# Patient Record
Sex: Female | Born: 1960 | Race: White | Hispanic: No | Smoking: Current every day smoker
Health system: Southern US, Community
[De-identification: ages and names within clinical notes are randomized; demographics above are authoritative.]

## PROBLEM LIST (undated history)

## (undated) DIAGNOSIS — M2550 Pain in unspecified joint: Secondary | ICD-10-CM

## (undated) DIAGNOSIS — R5383 Other fatigue: Secondary | ICD-10-CM

## (undated) DIAGNOSIS — G709 Myoneural disorder, unspecified: Secondary | ICD-10-CM

## (undated) DIAGNOSIS — K3532 Acute appendicitis with perforation and localized peritonitis, without abscess: Secondary | ICD-10-CM

## (undated) DIAGNOSIS — G4730 Sleep apnea, unspecified: Secondary | ICD-10-CM

## (undated) DIAGNOSIS — G2581 Restless legs syndrome: Secondary | ICD-10-CM

## (undated) DIAGNOSIS — F419 Anxiety disorder, unspecified: Secondary | ICD-10-CM

## (undated) HISTORY — DX: Restless legs syndrome: G25.81

## (undated) HISTORY — PX: CARPAL TUNNEL RELEASE: SHX101

## (undated) HISTORY — DX: Myoneural disorder, unspecified: G70.9

## (undated) HISTORY — DX: Other fatigue: R53.83

## (undated) HISTORY — DX: Acute appendicitis with perforation, localized peritonitis, and gangrene, without abscess: K35.32

## (undated) HISTORY — DX: Anxiety disorder, unspecified: F41.9

## (undated) HISTORY — DX: Pain in unspecified joint: M25.50

---

## 2015-08-01 ENCOUNTER — Other Ambulatory Visit (HOSPITAL_COMMUNITY): Payer: Self-pay | Admitting: Family Medicine

## 2015-08-01 DIAGNOSIS — Z1231 Encounter for screening mammogram for malignant neoplasm of breast: Secondary | ICD-10-CM

## 2015-08-05 ENCOUNTER — Ambulatory Visit (HOSPITAL_COMMUNITY)
Admission: RE | Admit: 2015-08-05 | Discharge: 2015-08-05 | Disposition: A | Discharge status: Home / Self Care | Payer: BLUE CROSS/BLUE SHIELD | Source: Ambulatory Visit | Attending: Family Medicine | Admitting: Family Medicine

## 2015-08-05 DIAGNOSIS — Z1231 Encounter for screening mammogram for malignant neoplasm of breast: Secondary | ICD-10-CM | POA: Insufficient documentation

## 2015-09-12 ENCOUNTER — Encounter: Payer: Self-pay | Admitting: Neurology

## 2015-09-12 ENCOUNTER — Ambulatory Visit (INDEPENDENT_AMBULATORY_CARE_PROVIDER_SITE_OTHER): Payer: BLUE CROSS/BLUE SHIELD | Admitting: Neurology

## 2015-09-12 VITALS — BP 117/80 | HR 74 | Ht 65.0 in | Wt 171.5 lb

## 2015-09-12 DIAGNOSIS — R5382 Chronic fatigue, unspecified: Secondary | ICD-10-CM

## 2015-09-12 DIAGNOSIS — G4710 Hypersomnia, unspecified: Secondary | ICD-10-CM | POA: Diagnosis not present

## 2015-09-12 DIAGNOSIS — M2550 Pain in unspecified joint: Secondary | ICD-10-CM

## 2015-09-12 MED ORDER — MELOXICAM 7.5 MG PO TABS: 7.5000 mg | ORAL_TABLET | Freq: Two times a day (BID) | ORAL | Status: DC | PRN

## 2015-09-12 NOTE — Progress Notes (Signed)
PATIENT: Amanda Wilcox DOB: 1961/04/16  Chief Complaint  Patient presents with  . Joint Pain    She is here to have her persistent joint pain and fatigue evaluated.  Her pain tends to be worse in her neck, shoulders and low back.  She is concerned about excessive daytime sleepiness.     HISTORICAL  Amanda Wilcox is a 55 years old left-handed female, alone at today's clinical visit, seen in refer by her primary care PA Jake Samples for evaluation of multiple joints pain, fatigue in September 12 2015,  She is a longtime smoker, currently taking Wellbutrin, trying to quit smoking, worked at [employer].  She complains more than 10 years history of chronic neck, low back pain, getting worse in the past couple years, she has constant neck, bilateral shoulder deep achy pain, could not find a comfortable position to sleep, denies persistent upper lower extremity paresthesia or weakness, she has urinary urgency, has intermittent constipation and diarrhea, has no incontinence. She also complains of excessive daytime fatigue, sleepiness, today's ESS score is 11, FSS score is 53, she complains of excessive nighttime snoring, frequent awakening with dry mouth.  She also complains of frequent bilateral lower extremity numbness day sitting in a soft recliner, but denied persistent sensory changes or weakness   Palma Holter in her legs, she could not benign over, aggravated by going hte dargen work,   I reviewed laboratory evaluation in February 2017, normal CBC, CMP, vitamin D level was mildly decreased 27.6, mild elevated LDL 112, total cholesterol 223, ESR  REVIEW OF SYSTEMS: Full 14 system review of systems performed and notable only for Fatigue, snoring, diarrhea, constipation, joint pain, achy muscles, allergy, runny nose, numbness, weakness, insomnia, snoring, restless legs, anxiety, not enough sleep.  ALLERGIES: No Known Allergies  HOME MEDICATIONS: Current Outpatient Prescriptions    Medication Sig Dispense Refill  . buPROPion (WELLBUTRIN SR) 150 MG 12 hr tablet Take 150 mg by mouth 2 (two) times daily.     No current facility-administered medications for this visit.    PAST MEDICAL HISTORY: Past Medical History  Diagnosis Date  . Restless leg syndrome   . Arthralgia   . Fatigue   . Anxiety     Trying to quit smoking - has cut back.    PAST SURGICAL HISTORY: Past Surgical History  Procedure Laterality Date  . Carpal tunnel release Right     FAMILY HISTORY: Family History  Problem Relation Age of Onset  . Stroke Mother     SOCIAL HISTORY:  Social History   Social History  . Marital Status: Divorced    Spouse Name: N/A  . Number of Children: 3  . Years of Education: GED   Occupational History  . Machinery/Factory worker    Social History Main Topics  . Smoking status: Current Every Day Smoker -- 0.50 packs/day    Types: Cigarettes  . Smokeless tobacco: Not on file  . Alcohol Use: No  . Drug Use: No  . Sexual Activity: Not on file   Other Topics Concern  . Not on file   Social History Narrative   Lives at home with roommate.   Left-handed.   2-3 cups caffeine per day.     PHYSICAL EXAM   Filed Vitals:   09/12/15 1304  BP: 117/80  Pulse: 74  Height: 5' 5"  (1.651 m)  Weight: 171 lb 8 oz (77.792 kg)    Not recorded      Body mass index is  28.54 kg/(m^2).  PHYSICAL EXAMNIATION:  Gen: NAD, conversant, well nourised, obese, well groomed                     Cardiovascular: Regular rate rhythm, no peripheral edema, warm, nontender. Eyes: Conjunctivae clear without exudates or hemorrhage Neck: Supple, no carotid bruise. Pulmonary: Clear to auscultation bilaterally   NEUROLOGICAL EXAM:  MENTAL STATUS: Speech:    Speech is normal; fluent and spontaneous with normal comprehension.  Cognition:     Orientation to time, place and person     Normal recent and remote memory     Normal Attention span and concentration      Normal Language, naming, repeating,spontaneous speech     Fund of knowledge   CRANIAL NERVES: CN II: Visual fields are full to confrontation. Fundoscopic exam is normal with sharp discs and no vascular changes. Pupils are round equal and briskly reactive to light. CN III, IV, VI: extraocular movement are normal. No ptosis. CN V: Facial sensation is intact to pinprick in all 3 divisions bilaterally. Corneal responses are intact.  CN VII: Face is symmetric with normal eye closure and smile. CN VIII: Hearing is normal to rubbing fingers CN IX, X: Palate elevates symmetrically. Phonation is normal. CN XI: Head turning and shoulder shrug are intact CN XII: Tongue is midline with normal movements and no atrophy.  MOTOR: There is no pronator drift of out-stretched arms. Muscle bulk and tone are normal. Muscle strength is normal.  REFLEXES: Reflexes are 2+ and symmetric at the biceps, triceps, knees, and ankles. Plantar responses are flexor.  SENSORY: Intact to light touch, pinprick, positional sensation and vibratory sensation are intact in fingers and toes.  COORDINATION: Rapid alternating movements and fine finger movements are intact. There is no dysmetria on finger-to-nose and heel-knee-shin.    GAIT/STANCE: Posture is normal. Gait is steady with normal steps, base, arm swing, and turning. Heel and toe walking are normal. Tandem gait is normal.  Romberg is absent.   DIAGNOSTIC DATA (LABS, IMAGING, TESTING) - I reviewed patient records, labs, notes, testing and imaging myself where available.   ASSESSMENT AND PLAN  Damyra Luscher is a 55 y.o. female   Excessive fatigue, daytime sleepiness ESS is 11, FSS is 53  She also has mild snoring at nighttime,  Consistent with obstructive sleep apnea  Refer her to sleep study  Chronic neck pain, low back pain,  EMG nerve conduction study  Laboratory evaluations, including CPK, TSH, to rule out inflammatory myopathy, differentiation  diagnosis also includes cervical and lumbar sacral radiculopathy  Mobic 7.5 twice a day  as needed   Marcial Pacas, M.D. Ph.D.  [facility] [address] [place], [place] [address] Ph: [phone] Fax: [phone]  CC: Jake Samples, PA-C

## 2015-09-13 LAB — CK: Total CK: 135 U/L (ref 24–173)

## 2015-09-13 LAB — ANA W/REFLEX IF POSITIVE
Anti Nuclear Antibody(ANA): POSITIVE — AB
Centromere Ab Screen: <0.2 AI (ref 0.0–0.9)
Chromatin Ab SerPl-aCnc: <0.2 AI (ref 0.0–0.9)
DSDNA AB: 20 [IU]/mL — AB (ref 0–9)
ENA RNP Ab: <0.2 AI (ref 0.0–0.9)
ENA SM Ab Ser-aCnc: <0.2 AI (ref 0.0–0.9)
SCL 70: 0.2 AI (ref 0.0–0.9)

## 2015-09-13 LAB — C-REACTIVE PROTEIN: CRP: 1.3 mg/L (ref 0.0–4.9)

## 2015-09-13 LAB — THYROID PANEL WITH TSH
FREE THYROXINE INDEX: 2.1 (ref 1.2–4.9)
T3 UPTAKE RATIO: 26 % (ref 24–39)
T4, Total: 8.1 ug/dL (ref 4.5–12.0)
TSH: 1.72 u[IU]/mL (ref 0.450–4.500)

## 2015-09-16 ENCOUNTER — Telehealth: Payer: Self-pay | Admitting: Neurology

## 2015-09-16 NOTE — Telephone Encounter (Signed)
Attempted to reach patient again - left another message for a return call.

## 2015-09-16 NOTE — Telephone Encounter (Signed)
Left message for a return call

## 2015-09-16 NOTE — Telephone Encounter (Signed)
Please call patient, laboratory evaluation showed positive ANA with double-stranded DNA antibody, which usually indicating autoimmune process, I have forwarded to laboratory evaluation to her primary care physician, she should contact them for repeat testing potentially for the workup, she should keep her follow-up appointment for EMG nerve conduction study in May 30 first 2017

## 2015-09-17 NOTE — Telephone Encounter (Signed)
Left message for a return call

## 2015-09-17 NOTE — Telephone Encounter (Signed)
She is aware of lab results and will contact her PCP to schedule a follow up appt.  She will also keep her pending EMG/NCV appt with Dr. Krista Blue.

## 2015-10-23 ENCOUNTER — Encounter: Payer: BLUE CROSS/BLUE SHIELD | Admitting: Neurology

## 2016-04-07 ENCOUNTER — Other Ambulatory Visit (HOSPITAL_COMMUNITY): Payer: Self-pay | Admitting: Neurology

## 2016-04-07 ENCOUNTER — Ambulatory Visit (HOSPITAL_COMMUNITY)
Admission: RE | Admit: 2016-04-07 | Discharge: 2016-04-07 | Disposition: A | Discharge status: Home / Self Care | Payer: BLUE CROSS/BLUE SHIELD | Source: Ambulatory Visit | Attending: Neurology | Admitting: Neurology

## 2016-04-07 DIAGNOSIS — M50322 Other cervical disc degeneration at C5-C6 level: Secondary | ICD-10-CM | POA: Insufficient documentation

## 2016-04-07 DIAGNOSIS — Q765 Cervical rib: Secondary | ICD-10-CM | POA: Diagnosis not present

## 2016-04-07 DIAGNOSIS — R52 Pain, unspecified: Secondary | ICD-10-CM

## 2016-04-07 DIAGNOSIS — M542 Cervicalgia: Secondary | ICD-10-CM | POA: Diagnosis present

## 2016-05-22 ENCOUNTER — Encounter (HOSPITAL_BASED_OUTPATIENT_CLINIC_OR_DEPARTMENT_OTHER): Payer: Self-pay

## 2016-05-22 DIAGNOSIS — G4733 Obstructive sleep apnea (adult) (pediatric): Secondary | ICD-10-CM

## 2016-06-07 ENCOUNTER — Ambulatory Visit: Payer: BLUE CROSS/BLUE SHIELD | Attending: Neurology | Admitting: Neurology

## 2016-06-07 DIAGNOSIS — G4733 Obstructive sleep apnea (adult) (pediatric): Secondary | ICD-10-CM | POA: Insufficient documentation

## 2016-06-11 NOTE — Procedures (Signed)
   Theodore A. Merlene Laughter, MD     [email or web address]             NOCTURNAL POLYSOMNOGRAPHY   LOCATION: [facility]  Patient Name: Amanda Wilcox, Amanda Wilcox Study Date: 06/07/2016 Gender: Female D.O.B: Oct 16, 1960 Age (years): 78 Referring Provider: Barton Fanny NP Height (inches): 64 Interpreting Physician: Phillips Odor MD, ABSM Weight (lbs): 167 RPSGT: Rosebud Poles BMI: 29 MRN: [id number] Neck Size: 14.50 CLINICAL INFORMATION Sleep Study Type: NPSG  Indication for sleep study: N/A  Epworth Sleepiness Score: 17  SLEEP STUDY TECHNIQUE As per the AASM Manual for the Scoring of Sleep and Associated Events v2.3 (April 2016) with a hypopnea requiring 4% desaturations.  The channels recorded and monitored were frontal, central and occipital EEG, electrooculogram (EOG), submentalis EMG (chin), nasal and oral airflow, thoracic and abdominal wall motion, anterior tibialis EMG, snore microphone, electrocardiogram, and pulse oximetry.  MEDICATIONS Medications self-administered by patient taken the night of the study : N/A  Current Outpatient Prescriptions:  .  buPROPion (WELLBUTRIN SR) 150 MG 12 hr tablet, Take 150 mg by mouth 2 (two) times daily., Disp: , Rfl:  .  meloxicam (MOBIC) 7.5 MG tablet, Take 1 tablet (7.5 mg total) by mouth 2 (two) times daily as needed for pain., Disp: 60 tablet, Rfl: 6   SLEEP ARCHITECTURE The study was initiated at 10:12:32 PM and ended at 4:38:52 AM.  Sleep onset time was 5.2 minutes and the sleep efficiency was 94.6%. The total sleep time was 365.5 minutes.  Stage REM latency was 188.5 minutes.  The patient spent 2.87% of the night in stage N1 sleep, 49.93% in stage N2 sleep, 27.22% in stage N3 and 19.97% in REM.  Alpha intrusion was absent.  Supine sleep was 4.38%.  RESPIRATORY PARAMETERS The overall apnea/hypopnea index (AHI) was 10.0 per hour. There were 16 total apneas, including 6 obstructive, 2 central and 8 mixed  apneas. There were 45 hypopneas and 9 RERAs.  The AHI during Stage REM sleep was 46.0 per hour.  AHI while supine was 60.0 per hour.  The mean oxygen saturation was 91.83%. The minimum SpO2 during sleep was 83.00%.  Soft snoring was noted during this study.  CARDIAC DATA The 2 lead EKG demonstrated sinus rhythm. The mean heart rate was N/A beats per minute. Other EKG findings include: None. LEG MOVEMENT DATA The total PLMS were 21 with a resulting PLMS index of 3.45. Associated arousal with leg movement index was 3.0.  IMPRESSIONS - Mild to moderate obstructive sleep apnea is observed during this recording. A trial of auto Pap 8-15 is recommended.   Delano Metz, MD Diplomate, American Board of Sleep Medicine.

## 2016-11-15 ENCOUNTER — Emergency Department (HOSPITAL_COMMUNITY)
Admission: EM | Admit: 2016-11-15 | Discharge: 2016-11-15 | Disposition: A | Discharge status: Home Health | Payer: BLUE CROSS/BLUE SHIELD | Attending: Emergency Medicine | Admitting: Emergency Medicine

## 2016-11-15 ENCOUNTER — Encounter (HOSPITAL_COMMUNITY): Payer: Self-pay | Admitting: Emergency Medicine

## 2016-11-15 DIAGNOSIS — S61411A Laceration without foreign body of right hand, initial encounter: Secondary | ICD-10-CM | POA: Insufficient documentation

## 2016-11-15 DIAGNOSIS — W260XXA Contact with knife, initial encounter: Secondary | ICD-10-CM | POA: Diagnosis not present

## 2016-11-15 DIAGNOSIS — Y929 Unspecified place or not applicable: Secondary | ICD-10-CM | POA: Diagnosis not present

## 2016-11-15 DIAGNOSIS — Y999 Unspecified external cause status: Secondary | ICD-10-CM | POA: Insufficient documentation

## 2016-11-15 DIAGNOSIS — Y9389 Activity, other specified: Secondary | ICD-10-CM | POA: Diagnosis not present

## 2016-11-15 DIAGNOSIS — S6991XA Unspecified injury of right wrist, hand and finger(s), initial encounter: Secondary | ICD-10-CM | POA: Diagnosis present

## 2016-11-15 MED ORDER — LIDOCAINE HCL (PF) 2 % IJ SOLN
10.0000 mL | Freq: Once | INTRAMUSCULAR | Status: DC
  Filled 2016-11-15: qty 10

## 2016-11-15 NOTE — ED Triage Notes (Signed)
Cut palm of rt hand with knife trying to get a dog bone out of a package

## 2016-11-15 NOTE — ED Provider Notes (Signed)
[facility] DEPT Provider Note   CSN: [id number] Arrival date & time: 11/15/16  1346     History   Chief Complaint Chief Complaint  Patient presents with  . Laceration    HPI Amanda Wilcox is a 56 y.o. female.  HPI   Amanda Wilcox is a 56 y.o. female who presents to the Emergency Department complaining of laceration to the right hand.  Occurred while using a kitchen knife to cut open a package.  She reports minimal bleeding.  She cleaned the wound with peroxide.  She denies numbness or weakness of her hand or fingers. Td is up to date  Past Medical History:  Diagnosis Date  . Anxiety    Trying to quit smoking - has cut back.  . Arthralgia   . Fatigue   . Restless leg syndrome     There are no active problems to display for this patient.   Past Surgical History:  Procedure Laterality Date  . CARPAL TUNNEL RELEASE Right     OB History    No data available       Home Medications    Prior to Admission medications   Medication Sig Start Date End Date Taking? Authorizing Provider  buPROPion (WELLBUTRIN SR) 150 MG 12 hr tablet Take 150 mg by mouth 2 (two) times daily.    [provider]  meloxicam (MOBIC) 7.5 MG tablet Take 1 tablet (7.5 mg total) by mouth 2 (two) times daily as needed for pain. 09/12/15   Marcial Pacas, MD    Family History Family History  Problem Relation Age of Onset  . Stroke Mother     Social History Social History  Substance Use Topics  . Smoking status: Former Smoker    Packs/day: 0.50    Years: 40.00    Types: Cigarettes    Quit date: 11/08/2016  . Smokeless tobacco: Never Used  . Alcohol use No     Allergies   Patient has no known allergies.   Review of Systems Review of Systems  Constitutional: Negative for chills and fever.  Musculoskeletal: Negative for arthralgias, back pain and joint swelling.  Skin: Positive for wound. Negative for color change.       Laceration right hand  Neurological: Negative  for dizziness, weakness and numbness.  Hematological: Does not bruise/bleed easily.  All other systems reviewed and are negative.    Physical Exam Updated Vital Signs BP 132/72 (BP Location: Right Arm)   Pulse 67   Temp 98.2 F (36.8 C) (Oral)   Resp 20   Ht 5\' 4"  (1.626 m)   Wt 74.8 kg (165 lb)   SpO2 98%   BMI 28.32 kg/m   Physical Exam  Constitutional: She is oriented to person, place, and time. She appears well-developed and well-nourished. No distress.  HENT:  Head: Atraumatic.  Cardiovascular: Normal rate, regular rhythm and intact distal pulses.   Pulmonary/Chest: Effort normal and breath sounds normal. No respiratory distress.  Musculoskeletal: Normal range of motion. She exhibits no tenderness.  2.5 cm laceration to the distal right palm near base of the index finger.  Bleeding controlled.  No FB's  Neurological: She is alert and oriented to person, place, and time. No sensory deficit.  Skin: Skin is warm. Capillary refill takes less than 2 seconds. No erythema.  Psychiatric: She has a normal mood and affect.  Nursing note and vitals reviewed.    ED Treatments / Results  Labs (all labs ordered are listed, but only abnormal results  are displayed) Labs Reviewed - No data to display  EKG  EKG Interpretation None       Radiology No results found.  Procedures Procedures (including critical care time)  LACERATION REPAIR Performed by: Irma Roulhac L. Authorized by: Hale Bogus Consent: Verbal consent obtained. Risks and benefits: risks, benefits and alternatives were discussed Consent given by: patient Patient identity confirmed: provided demographic data Prepped and Draped in normal sterile fashion Wound explored  Laceration Location: right hand  Laceration Length: 2.5 cm  No Foreign Bodies seen or palpated  Anesthesia: local infiltration  Local anesthetic: lidocaine 2 % w/o epinephrine  Anesthetic total: 2  ml  Irrigation method:  syringe Amount of cleaning: standard  Skin closure: 4-0 prolene  Number of sutures: 4  Technique: simple interrupted  Patient tolerance: Patient tolerated the procedure well with no immediate complications.  Medications Ordered in ED Medications - No data to display   Initial Impression / Assessment and Plan / ED Course  I have reviewed the triage vital signs and the nursing notes.  Pertinent labs & imaging results that were available during my care of the patient were reviewed by me and considered in my medical decision making (see chart for details).     NV intact.  Pt has full ROM of the fingers.  Wounds care instructions given,  Sutures out in 7-10 days.     Final Clinical Impressions(s) / ED Diagnoses   Final diagnoses:  Laceration of right hand without foreign body, initial encounter    New Prescriptions New Prescriptions   No medications on file     Bufford Lope 11/17/16 2049    Nat Christen, MD 11/23/16 [id number]

## 2016-11-15 NOTE — Discharge Instructions (Signed)
Clean the wound with mild soap and water.  Keep it bandaged.  Sutures out in 10 days.  Return here for any signs of infection

## 2017-03-17 ENCOUNTER — Ambulatory Visit: Payer: BLUE CROSS/BLUE SHIELD | Admitting: Family Medicine

## 2017-04-12 ENCOUNTER — Ambulatory Visit: Payer: BLUE CROSS/BLUE SHIELD | Admitting: Family Medicine

## 2017-05-05 ENCOUNTER — Ambulatory Visit: Payer: BLUE CROSS/BLUE SHIELD | Admitting: Family Medicine

## 2017-10-06 ENCOUNTER — Other Ambulatory Visit (HOSPITAL_COMMUNITY): Payer: Self-pay | Admitting: Neurology

## 2017-10-06 DIAGNOSIS — M542 Cervicalgia: Secondary | ICD-10-CM

## 2017-10-06 DIAGNOSIS — M5412 Radiculopathy, cervical region: Secondary | ICD-10-CM

## 2017-10-15 ENCOUNTER — Ambulatory Visit (HOSPITAL_COMMUNITY): Payer: BLUE CROSS/BLUE SHIELD

## 2017-10-19 ENCOUNTER — Ambulatory Visit (HOSPITAL_COMMUNITY)
Admission: RE | Admit: 2017-10-19 | Discharge: 2017-10-19 | Disposition: A | Discharge status: Home / Self Care | Payer: BLUE CROSS/BLUE SHIELD | Source: Ambulatory Visit | Attending: Neurology | Admitting: Neurology

## 2017-10-19 DIAGNOSIS — M4802 Spinal stenosis, cervical region: Secondary | ICD-10-CM | POA: Diagnosis not present

## 2017-10-19 DIAGNOSIS — M542 Cervicalgia: Secondary | ICD-10-CM

## 2017-10-19 DIAGNOSIS — M5412 Radiculopathy, cervical region: Secondary | ICD-10-CM | POA: Diagnosis present

## 2017-10-19 DIAGNOSIS — M4722 Other spondylosis with radiculopathy, cervical region: Secondary | ICD-10-CM | POA: Insufficient documentation

## 2017-12-07 ENCOUNTER — Other Ambulatory Visit: Payer: Self-pay | Admitting: Neurology

## 2017-12-07 DIAGNOSIS — M542 Cervicalgia: Secondary | ICD-10-CM

## 2017-12-08 ENCOUNTER — Ambulatory Visit (HOSPITAL_COMMUNITY)
Admission: RE | Admit: 2017-12-08 | Discharge: 2017-12-08 | Disposition: A | Discharge status: Home / Self Care | Payer: BLUE CROSS/BLUE SHIELD | Source: Ambulatory Visit | Attending: Neurology | Admitting: Neurology

## 2017-12-08 ENCOUNTER — Other Ambulatory Visit (HOSPITAL_COMMUNITY): Payer: Self-pay | Admitting: Neurology

## 2017-12-08 DIAGNOSIS — M545 Low back pain: Secondary | ICD-10-CM | POA: Insufficient documentation

## 2017-12-08 DIAGNOSIS — M5137 Other intervertebral disc degeneration, lumbosacral region: Secondary | ICD-10-CM | POA: Diagnosis not present

## 2017-12-20 ENCOUNTER — Ambulatory Visit
Admission: RE | Admit: 2017-12-20 | Discharge: 2017-12-20 | Disposition: A | Discharge status: Home / Self Care | Payer: BLUE CROSS/BLUE SHIELD | Source: Ambulatory Visit | Attending: Neurology | Admitting: Neurology

## 2017-12-20 DIAGNOSIS — M542 Cervicalgia: Secondary | ICD-10-CM

## 2017-12-20 MED ORDER — TRIAMCINOLONE ACETONIDE 40 MG/ML IJ SUSP (RADIOLOGY)
60.0000 mg | Freq: Once | INTRAMUSCULAR | Status: AC
  Administered 2017-12-20: 60 mg via EPIDURAL

## 2017-12-20 MED ORDER — IOPAMIDOL (ISOVUE-M 200) INJECTION 41%
1.0000 mL | Freq: Once | INTRAMUSCULAR | Status: AC
  Administered 2017-12-20: 1 mL via EPIDURAL

## 2017-12-20 NOTE — Discharge Instructions (Signed)

## 2018-01-12 ENCOUNTER — Other Ambulatory Visit: Payer: Self-pay | Admitting: Neurology

## 2018-01-12 DIAGNOSIS — M5412 Radiculopathy, cervical region: Secondary | ICD-10-CM

## 2018-01-28 ENCOUNTER — Ambulatory Visit
Admission: RE | Admit: 2018-01-28 | Discharge: 2018-01-28 | Disposition: A | Discharge status: Home / Self Care | Payer: BLUE CROSS/BLUE SHIELD | Source: Ambulatory Visit | Attending: Neurology | Admitting: Neurology

## 2018-01-28 ENCOUNTER — Other Ambulatory Visit: Payer: BLUE CROSS/BLUE SHIELD

## 2018-01-28 DIAGNOSIS — M5412 Radiculopathy, cervical region: Secondary | ICD-10-CM

## 2018-01-28 MED ORDER — TRIAMCINOLONE ACETONIDE 40 MG/ML IJ SUSP (RADIOLOGY)
60.0000 mg | Freq: Once | INTRAMUSCULAR | Status: AC
  Administered 2018-01-28: 60 mg via EPIDURAL

## 2018-01-28 MED ORDER — IOPAMIDOL (ISOVUE-M 300) INJECTION 61%
1.0000 mL | Freq: Once | INTRAMUSCULAR | Status: AC | PRN
  Administered 2018-01-28: 1 mL via EPIDURAL

## 2018-11-17 ENCOUNTER — Other Ambulatory Visit: Payer: Self-pay

## 2018-11-17 ENCOUNTER — Encounter (INDEPENDENT_AMBULATORY_CARE_PROVIDER_SITE_OTHER): Payer: Self-pay

## 2018-11-17 ENCOUNTER — Ambulatory Visit (INDEPENDENT_AMBULATORY_CARE_PROVIDER_SITE_OTHER): Payer: BC Managed Care – PPO | Admitting: Family Medicine

## 2018-11-17 ENCOUNTER — Encounter: Payer: Self-pay | Admitting: Family Medicine

## 2018-11-17 VITALS — BP 123/79 | HR 85 | Temp 98.9°F | Resp 12 | Ht 64.0 in | Wt 208.0 lb

## 2018-11-17 DIAGNOSIS — M797 Fibromyalgia: Secondary | ICD-10-CM

## 2018-11-17 DIAGNOSIS — Z720 Tobacco use: Secondary | ICD-10-CM | POA: Diagnosis not present

## 2018-11-17 DIAGNOSIS — E669 Obesity, unspecified: Secondary | ICD-10-CM | POA: Diagnosis not present

## 2018-11-17 NOTE — Progress Notes (Signed)
Subjective:     Patient ID: Amanda Wilcox, female   DOB: 1961-04-06, 58 y.o.   MRN: [id number]  Amanda Wilcox presents for New Patient (Initial Visit) (establish care) Amanda Wilcox is a 58 year old female patient who has a history of neuromuscular disorder-fibromyalgia, arthralgia, fatigue, anxiety, restless leg syndrome, current smoker.  Is currently on long-term disability.   Is followed by Dr. Florene Glen for her fibromyalgia.  Weight wise she reports that she has not always had a lot of weight on her.  But since she is been on disability she is put on a lot of weight.  She reports she does not eat a lot, unsure why her weight is up.  Was trying to walk some but has difficulty with that because of her pain and discomfort.  Reports that her menses stopped over 5 years ago.  Is unsure when her last Pap smear was.  Was getting her Pap smears with her last PCP at [facility].  Additionally unsure if she is fully up-to-date on her immunizations or other screenings.  Will be getting a release form for her records.  Social: Lives with her roommate, who was her ex-boyfriend.  They have 1 dog.  She has 2 daughters and 1 son.  She has several grandchildren.  She low spending time with her friends, play having a night, cookout spending time with her children and grandkids.  She reports that she eats a well-rounded diet.  That consist of heavy meat.  Veggies and fruits but could eat more veggies and fruits.  Reports she does eat a lot of breads and meats though.  Reports she has about 2 cups of coffee with cream and sugar.  Does not really drink a lot of soda and less as the only thing around.  Reports she only drinks about 5 cups of water daily.  Reports she does wear her seatbelt, sunscreen, has carbon monoxide and smoke detectors.  Additionally she reports she does not use her phone while she is driving.  She does not drink alcohol.  She is currently a smoker.  Overall Amanda Wilcox feels that she is doing well  today.  She does not have any fever, chills, cough, shortness of breath or any other signs or symptoms of infection.  She does report that she has some environmental allergies which cause her to have congestion and postnasal drip but she does not feel this is related to COVID or any other infections.  She denies having chest pain, chest tightness, leg swelling, palpitations, dizziness, headaches, vision changes.  She denies having any excessive thirst, hunger or urination.  Past Medical, Surgical, Social History, Allergies, and Medications have been Reviewed. .   Past Medical History:  Diagnosis Date  . Anxiety    Trying to quit smoking - has cut back.  . Arthralgia   . Fatigue   . Restless leg syndrome    Past Surgical History:  Procedure Laterality Date  . CARPAL TUNNEL RELEASE Right    Social History   Socioeconomic History  . Marital status: Divorced    Spouse name: Not on file  . Number of children: 3  . Years of education: GED  . Highest education level: Not on file  Occupational History  . Occupation: Chief Technology Officer  Social Needs  . Financial resource strain: Not on file  . Food insecurity    Worry: Not on file    Inability: Not on file  . Transportation needs    Medical: Not on  file    Non-medical: Not on file  Tobacco Use  . Smoking status: Former Smoker    Packs/day: 0.50    Years: 40.00    Pack years: 20.00    Types: Cigarettes    Quit date: 11/08/2016    Years since quitting: 2.0  . Smokeless tobacco: Never Used  Substance and Sexual Activity  . Alcohol use: No    Alcohol/week: 0.0 standard drinks  . Drug use: No  . Sexual activity: Not on file  Lifestyle  . Physical activity    Days per week: Not on file    Minutes per session: Not on file  . Stress: Not on file  Relationships  . Social Herbalist on phone: Not on file    Gets together: Not on file    Attends religious service: Not on file    Active member of club or  organization: Not on file    Attends meetings of clubs or organizations: Not on file    Relationship status: Not on file  . Intimate partner violence    Fear of current or ex partner: Not on file    Emotionally abused: Not on file    Physically abused: Not on file    Forced sexual activity: Not on file  Other Topics Concern  . Not on file  Social History Narrative   Lives at home with roommate.   Left-handed.   2-3 cups caffeine per day.    Outpatient Encounter Medications as of 11/17/2018  Medication Sig  . CHANTIX 1 MG tablet Take 1 mg by mouth daily.  . DULoxetine (CYMBALTA) 60 MG capsule Take 60 mg by mouth 2 (two) times a day.  . gabapentin (NEURONTIN) 300 MG capsule Take 300 mg by mouth 2 (two) times a day.  Marland Kitchen HYDROcodone-acetaminophen (NORCO/VICODIN) 5-325 MG tablet Take 1 tablet by mouth daily as needed for pain.  . meloxicam (MOBIC) 15 MG tablet Take 15 mg by mouth daily.  . modafinil (PROVIGIL) 200 MG tablet Take 200 mg by mouth daily.  Marland Kitchen oxybutynin (DITROPAN-XL) 5 MG 24 hr tablet Take 5 mg by mouth daily.  . [DISCONTINUED] buPROPion (WELLBUTRIN SR) 150 MG 12 hr tablet Take 150 mg by mouth 2 (two) times daily.  . [DISCONTINUED] meloxicam (MOBIC) 7.5 MG tablet Take 1 tablet (7.5 mg total) by mouth 2 (two) times daily as needed for pain. (Patient not taking: Reported on 11/17/2018)   No facility-administered encounter medications on file as of 11/17/2018.    No Known Allergies  Review of Systems  Constitutional: Negative for activity change, appetite change, chills and fever.  HENT: Positive for congestion and postnasal drip.        Dentist: needs to get in with one  Eyes: Negative for visual disturbance.       Eye dr: just saw a few wks back   Respiratory: Negative for cough and shortness of breath.   Cardiovascular: Negative for chest pain, palpitations and leg swelling.  Gastrointestinal: Positive for constipation.  Endocrine: Positive for cold intolerance and heat  intolerance. Negative for polydipsia, polyphagia and polyuria.  Genitourinary: Negative.   Musculoskeletal: Positive for myalgias.  Skin: Negative.   Allergic/Immunologic: Positive for environmental allergies.  Neurological: Negative for dizziness and headaches.  Hematological: Negative.   Psychiatric/Behavioral: Negative for sleep disturbance. The patient is nervous/anxious.   All other systems reviewed and are negative.      Objective:     BP 123/79   Pulse 85  Temp 98.9 F (37.2 C) (Temporal)   Resp 12   Ht 5\' 4"  (1.626 m)   Wt 208 lb (94.3 kg)   SpO2 96%   BMI 35.70 kg/m   Physical Exam Vitals signs and nursing note reviewed.  Constitutional:      Appearance: Normal appearance. She is obese.  HENT:     Head: Normocephalic and atraumatic.     Right Ear: External ear normal.     Left Ear: External ear normal.     Nose: Nose normal.  Eyes:     General:        Right eye: No discharge.        Left eye: No discharge.     Conjunctiva/sclera: Conjunctivae normal.  Neck:     Musculoskeletal: Normal range of motion and neck supple.  Cardiovascular:     Rate and Rhythm: Normal rate and regular rhythm.     Pulses: Normal pulses.     Heart sounds: Normal heart sounds.  Pulmonary:     Effort: Pulmonary effort is normal.     Breath sounds: Normal breath sounds.  Musculoskeletal: Normal range of motion.  Skin:    General: Skin is warm.  Neurological:     Mental Status: She is alert and oriented to person, place, and time.  Psychiatric:        Mood and Affect: Mood normal.        Behavior: Behavior normal.        Thought Content: Thought content normal.        Judgment: Judgment normal.        Assessment and Plan       1. Obesity (BMI 35.0-39.9 without comorbidity) No well controlled. Provided with information about diet, exercise. Encouraged 30 minutes of walking daily.  In addition to eating a more well-balanced diet.  2. Fibromyalgia Currently controlled  and followed by Dr Florene Glen. Will continue with their treatment plans. Appreciate collaboration in her care.  3. Nicotine abuse Asked about quitting: confirms they are currently smokes cigarettes Advise to quit smoking: Educated about QUITTING to reduce the risk of cancer, cardio and cerebrovascular disease. Assess willingness: Unwilling to quit at this time, but is working on cutting back. Assist with counseling and pharmacotherapy: Counseled for 5 minutes and literature provided. Arrange for follow up: not quitting follow up and continue to offer help.    Follow up: 9 months with pap      Amanda Mayo, DNP, AGNP-BC [place], [place], [place] [address] Office Hours: Mon-Thurs 8 am-5 pm; Fri 8 am-12 pm Office Phone:  [phone]  Office Fax: [phone]

## 2018-11-17 NOTE — Patient Instructions (Signed)
Thank you for coming into the office today. I appreciate the opportunity to provide you with the care for your health and wellness. Today we discussed: overall health   Follow Up: 9 months (annual with pap)  No labs today.  Please work on smoking cessation. I have attached information for review. As well as work to get 8 cups of water in daily and to eat a more balanced diet.  When able I strong encourage you to walk 30 minutes daily to help with weight loss and heart health.  [place] YOUR HANDS WELL AND FREQUENTLY. AVOID TOUCHING YOUR FACE, UNLESS YOUR HANDS ARE FRESHLY WASHED.  GET FRESH AIR DAILY. STAY HYDRATED WITH WATER.   It was a pleasure to see you and I look forward to continuing to work together on your health and well-being. Please do not hesitate to call the office if you need care or have questions about your care.  Have a wonderful day and week.  With Gratitude,  Cherly Beach, DNP, AGNP-BC    Please think about quitting smoking.  This is very important for your health.  Consider setting a quit date, then cutting back or switching brands to prepare to stop.  Also think of the money you will save every day by not smoking.  Quick Tips to Quit Smoking:  Fix a date i.e. keep a date in mind from when you would not touch a tobacco product to smoke   Keep yourself busy and block your mind with work loads or reading books or watching movies in malls where smoking is not allowed   Vanish off the things which reminds you about smoking for example match box, or your favorite lighter, or the pipe you used for smoking, or your favorite jeans and shirt with which you used to enjoy smoking, or the club where you used to do smoking   Try to avoid certain people places and incidences where and with whom smoking is a common factor to add on   Praise yourself with some token gifts from the money you saved by stopping smoking   Anti Smoking teams are there to help you. Join  their programs   Anti-smoking Gums are there in many medical shops. Try them to quit smoking   Side-effects of Smoking:  Disease caused by smoking cigarettes are emphysema, bronchitis, heart failures   Premature death   Cancer is the major side effect of smoking   Heart attacks and strokes are the quick effects of smoking causing sudden death   Some smokers lives end up with limbs amputated   Breathing problem or fast breathing is another side effect of smoking   Due to more intakes of smokes, carbon mono-oxide goes into your brain and other muscles of the body which leads to swelling of the veins and blockage to the air passage to lungs   Carbon monoxide blocks blood vessels which leads to blockage in the flow of blood to different major body organs like heart lungs and thus leads to attacks and deaths   During pregnancy smoking is very harmful and leads to premature birth of the infant, spontaneous abortions, low weight of the infant during birth   Fat depositions to narrow and blocked blood vessels causing heart attacks   In many cases cigarette smoking caused infertility in men     Coping with Quitting Smoking  Quitting smoking is a physical and mental challenge. You will face cravings, withdrawal symptoms, and temptation. Before quitting, work  with your health care provider to make a plan that can help you cope. Preparation can help you quit and keep you from giving in. How can I cope with cravings? Cravings usually last for 5-10 minutes. If you get through it, the craving will pass. Consider taking the following actions to help you cope with cravings:  Keep your mouth busy: ? Chew sugar-free gum. ? Suck on hard candies or a straw. ? Brush your teeth.  Keep your hands and body busy: ? Immediately change to a different activity when you feel a craving. ? Squeeze or play with a ball. ? Do an activity or a hobby, like making bead jewelry, practicing needlepoint, or  working with wood. ? Mix up your normal routine. ? Take a short exercise break. Go for a quick walk or run up and down stairs. ? Spend time in public places where smoking is not allowed.  Focus on doing something kind or helpful for someone else.  Call a friend or family member to talk during a craving.  Join a support group.  Call a quit line, such as 1-800-QUIT-NOW.  Talk with your health care provider about medicines that might help you cope with cravings and make quitting easier for you. How can I deal with withdrawal symptoms? Your body may experience negative effects as it tries to get used to not having nicotine in the system. These effects are called withdrawal symptoms. They may include:  Feeling hungrier than normal.  Trouble concentrating.  Irritability.  Trouble sleeping.  Feeling depressed.  Restlessness and agitation.  Craving a cigarette. To manage withdrawal symptoms:  Avoid places, people, and activities that trigger your cravings.  Remember why you want to quit.  Get plenty of sleep.  Avoid coffee and other caffeinated drinks. These may worsen some of your symptoms. How can I handle social situations? Social situations can be difficult when you are quitting smoking, especially in the first few weeks. To manage this, you can:  Avoid parties, bars, and other social situations where people might be smoking.  Avoid alcohol.  Leave right away if you have the urge to smoke.  Explain to your family and friends that you are quitting smoking. Ask for understanding and support.  Plan activities with friends or family where smoking is not an option. What are some ways I can cope with stress? Wanting to smoke may cause stress, and stress can make you want to smoke. Find ways to manage your stress. Relaxation techniques can help. For example:  Breathe slowly and deeply, in through your nose and out through your mouth.  Listen to soothing, relaxing  music.  Talk with a family member or friend about your stress.  Light a candle.  Soak in a bath or take a shower.  Think about a peaceful place. What are some ways I can prevent weight gain? Be aware that many people gain weight after they quit smoking. However, not everyone does. To keep from gaining weight, have a plan in place before you quit and stick to the plan after you quit. Your plan should include:  Having healthy snacks. When you have a craving, it may help to: ? Eat plain popcorn, crunchy carrots, celery, or other cut vegetables. ? Chew sugar-free gum.  Changing how you eat: ? Eat small portion sizes at meals. ? Eat 4-6 small meals throughout the day instead of 1-2 large meals a day. ? Be mindful when you eat. Do not watch television or do other  things that might distract you as you eat.  Exercising regularly: ? Make time to exercise each day. If you do not have time for a long workout, do short bouts of exercise for 5-10 minutes several times a day. ? Do some form of strengthening exercise, like weight lifting, and some form of aerobic exercise, like running or swimming.  Drinking plenty of water or other low-calorie or no-calorie drinks. Drink 6-8 glasses of water daily, or as much as instructed by your health care provider. Summary  Quitting smoking is a physical and mental challenge. You will face cravings, withdrawal symptoms, and temptation to smoke again. Preparation can help you as you go through these challenges.  You can cope with cravings by keeping your mouth busy (such as by chewing gum), keeping your body and hands busy, and making calls to family, friends, or a helpline for people who want to quit smoking.  You can cope with withdrawal symptoms by avoiding places where people smoke, avoiding drinks with caffeine, and getting plenty of rest.  Ask your health care provider about the different ways to prevent weight gain, avoid stress, and handle social  situations. This information is not intended to replace advice given to you by your health care provider. Make sure you discuss any questions you have with your health care provider. Document Released: 05/08/2016 Document Revised: 05/08/2016 Document Reviewed: 05/08/2016 Elsevier Interactive Patient Education  2019 [employer]  Walking is a great form of exercise to increase your strength, endurance and overall fitness.  A walking program can help you start slowly and gradually build endurance as you go.  Everyone's ability is different, so each person's starting point will be different.  You do not have to follow them exactly.  The are just samples. You should simply find out what's right for you and stick to that program.   In the beginning, you'll start off walking 2-3 times a day for short distances.  As you get stronger, you'll be walking further at just 1-2 times per day.  A. You Can Walk For A Certain Length Of Time Each Day    Walk 5 minutes 3 times per day.  Increase 2 minutes every 2 days (3 times per day).  Work up to 25-30 minutes (1-2 times per day).   Example:   Day 1-2 5 minutes 3 times per day   Day 7-8 12 minutes 2-3 times per day   Day 13-14 25 minutes 1-2 times per day  B. You Can Walk For a Certain Distance Each Day     Distance can be substituted for time.    Example:   3 trips to mailbox (at road)   3 trips to corner of block   3 trips around the block  C. Go to local high school and use the track.     Calorie Counting for Weight Loss Calories are units of energy. Your body needs a certain amount of calories from food to keep you going throughout the day. When you eat more calories than your body needs, your body stores the extra calories as fat. When you eat fewer calories than your body needs, your body burns fat to get the energy it needs. Calorie counting means keeping track of how many calories you eat and drink each day. Calorie counting  can be helpful if you need to lose weight. If you make sure to eat fewer calories than your body needs, you should lose weight. Ask your  health care provider what a healthy weight is for you. For calorie counting to work, you will need to eat the right number of calories in a day in order to lose a healthy amount of weight per week. A dietitian can help you determine how many calories you need in a day and will give you suggestions on how to reach your calorie goal.  A healthy amount of weight to lose per week is usually 1-2 lb (0.5-0.9 kg). This usually means that your daily calorie intake should be reduced by 500-750 calories.  Eating 1,200 - 1,500 calories per day can help most women lose weight.  Eating 1,500 - 1,800 calories per day can help most men lose weight.  What do I need to know about calorie counting? In order to meet your daily calorie goal, you will need to:  Find out how many calories are in each food you would like to eat. Try to do this before you eat.  Decide how much of the food you plan to eat.  Write down what you ate and how many calories it had. Doing this is called keeping a food log. To successfully lose weight, it is important to balance calorie counting with a healthy lifestyle that includes regular activity. Aim for 150 minutes of moderate exercise (such as walking) or 75 minutes of vigorous exercise (such as running) each week. Where do I find calorie information?  The number of calories in a food can be found on a Nutrition Facts label. If a food does not have a Nutrition Facts label, try to look up the calories online or ask your dietitian for help. Remember that calories are listed per serving. If you choose to have more than one serving of a food, you will have to multiply the calories per serving by the amount of servings you plan to eat. For example, the label on a package of bread might say that a serving size is 1 slice and that there are 90 calories in a  serving. If you eat 1 slice, you will have eaten 90 calories. If you eat 2 slices, you will have eaten 180 calories. How do I keep a food log? Immediately after each meal, record the following information in your food log:  What you ate. Don't forget to include toppings, sauces, and other extras on the food.  How much you ate. This can be measured in cups, ounces, or number of items.  How many calories each food and drink had.  The total number of calories in the meal. Keep your food log near you, such as in a small notebook in your pocket, or use a mobile app or website. Some programs will calculate calories for you and show you how many calories you have left for the day to meet your goal. What are some calorie counting tips?   Use your calories on foods and drinks that will fill you up and not leave you hungry: ? Some examples of foods that fill you up are nuts and nut butters, vegetables, lean proteins, and high-fiber foods like whole grains. High-fiber foods are foods with more than 5 g fiber per serving. ? Drinks such as sodas, specialty coffee drinks, alcohol, and juices have a lot of calories, yet do not fill you up.  Eat nutritious foods and avoid empty calories. Empty calories are calories you get from foods or beverages that do not have many vitamins or protein, such as candy, sweets, and soda. It is  better to have a nutritious high-calorie food (such as an avocado) than a food with few nutrients (such as a bag of chips).  Know how many calories are in the foods you eat most often. This will help you calculate calorie counts faster.  Pay attention to calories in drinks. Low-calorie drinks include water and unsweetened drinks.  Pay attention to nutrition labels for "low fat" or "fat free" foods. These foods sometimes have the same amount of calories or more calories than the full fat versions. They also often have added sugar, starch, or salt, to make up for flavor that was removed  with the fat.  Find a way of tracking calories that works for you. Get creative. Try different apps or programs if writing down calories does not work for you. What are some portion control tips?  Know how many calories are in a serving. This will help you know how many servings of a certain food you can have.  Use a measuring cup to measure serving sizes. You could also try weighing out portions on a kitchen scale. With time, you will be able to estimate serving sizes for some foods.  Take some time to put servings of different foods on your favorite plates, bowls, and cups so you know what a serving looks like.  Try not to eat straight from a bag or box. Doing this can lead to overeating. Put the amount you would like to eat in a cup or on a plate to make sure you are eating the right portion.  Use smaller plates, glasses, and bowls to prevent overeating.  Try not to multitask (for example, watch TV or use your computer) while eating. If it is time to eat, sit down at a table and enjoy your food. This will help you to know when you are full. It will also help you to be aware of what you are eating and how much you are eating. What are tips for following this plan? Reading food labels  Check the calorie count compared to the serving size. The serving size may be smaller than what you are used to eating.  Check the source of the calories. Make sure the food you are eating is high in vitamins and protein and low in saturated and trans fats. Shopping  Read nutrition labels while you shop. This will help you make healthy decisions before you decide to purchase your food.  Make a grocery list and stick to it. Cooking  Try to cook your favorite foods in a healthier way. For example, try baking instead of frying.  Use low-fat dairy products. Meal planning  Use more fruits and vegetables. Half of your plate should be fruits and vegetables.  Include lean proteins like poultry and  fish. How do I count calories when eating out?  Ask for smaller portion sizes.  Consider sharing an entree and sides instead of getting your own entree.  If you get your own entree, eat only half. Ask for a box at the beginning of your meal and put the rest of your entree in it so you are not tempted to eat it.  If calories are listed on the menu, choose the lower calorie options.  Choose dishes that include vegetables, fruits, whole grains, low-fat dairy products, and lean protein.  Choose items that are boiled, broiled, grilled, or steamed. Stay away from items that are buttered, battered, fried, or served with cream sauce. Items labeled "crispy" are usually fried, unless stated otherwise.  Choose water, low-fat milk, unsweetened iced tea, or other drinks without added sugar. If you want an alcoholic beverage, choose a lower calorie option such as a glass of wine or light beer.  Ask for dressings, sauces, and syrups on the side. These are usually high in calories, so you should limit the amount you eat.  If you want a salad, choose a garden salad and ask for grilled meats. Avoid extra toppings like bacon, cheese, or fried items. Ask for the dressing on the side, or ask for olive oil and vinegar or lemon to use as dressing.  Estimate how many servings of a food you are given. For example, a serving of cooked rice is  cup or about the size of half a baseball. Knowing serving sizes will help you be aware of how much food you are eating at restaurants. The list below tells you how big or small some common portion sizes are based on everyday objects: ? 1 oz--4 stacked dice. ? 3 oz--1 deck of cards. ? 1 tsp--1 die. ? 1 Tbsp-- a ping-pong ball. ? 2 Tbsp--1 ping-pong ball. ?  cup-- baseball. ? 1 cup--1 baseball. Summary  Calorie counting means keeping track of how many calories you eat and drink each day. If you eat fewer calories than your body needs, you should lose weight.  A  healthy amount of weight to lose per week is usually 1-2 lb (0.5-0.9 kg). This usually means reducing your daily calorie intake by 500-750 calories.  The number of calories in a food can be found on a Nutrition Facts label. If a food does not have a Nutrition Facts label, try to look up the calories online or ask your dietitian for help.  Use your calories on foods and drinks that will fill you up, and not on foods and drinks that will leave you hungry.  Use smaller plates, glasses, and bowls to prevent overeating. This information is not intended to replace advice given to you by your health care provider. Make sure you discuss any questions you have with your health care provider. Document Released: 05/11/2005 Document Revised: 01/28/2018 Document Reviewed: 04/10/2016 Elsevier Interactive Patient Education  2019 [employer].

## 2019-01-02 ENCOUNTER — Encounter: Payer: Self-pay | Admitting: Family Medicine

## 2019-01-02 ENCOUNTER — Other Ambulatory Visit: Payer: Self-pay

## 2019-01-02 ENCOUNTER — Encounter (INDEPENDENT_AMBULATORY_CARE_PROVIDER_SITE_OTHER): Payer: Self-pay

## 2019-01-02 ENCOUNTER — Ambulatory Visit (INDEPENDENT_AMBULATORY_CARE_PROVIDER_SITE_OTHER): Payer: BC Managed Care – PPO | Admitting: Family Medicine

## 2019-01-02 VITALS — BP 123/75 | HR 90 | Temp 99.6°F | Resp 12 | Ht 64.0 in | Wt 202.0 lb

## 2019-01-02 DIAGNOSIS — R0602 Shortness of breath: Secondary | ICD-10-CM | POA: Insufficient documentation

## 2019-01-02 DIAGNOSIS — M797 Fibromyalgia: Secondary | ICD-10-CM | POA: Insufficient documentation

## 2019-01-02 DIAGNOSIS — E669 Obesity, unspecified: Secondary | ICD-10-CM | POA: Insufficient documentation

## 2019-01-02 DIAGNOSIS — R509 Fever, unspecified: Secondary | ICD-10-CM | POA: Diagnosis not present

## 2019-01-02 DIAGNOSIS — Z720 Tobacco use: Secondary | ICD-10-CM | POA: Diagnosis not present

## 2019-01-02 DIAGNOSIS — R1084 Generalized abdominal pain: Secondary | ICD-10-CM | POA: Insufficient documentation

## 2019-01-02 DIAGNOSIS — Z20822 Contact with and (suspected) exposure to covid-19: Secondary | ICD-10-CM

## 2019-01-02 HISTORY — DX: Generalized abdominal pain: R10.84

## 2019-01-02 HISTORY — DX: Shortness of breath: R06.02

## 2019-01-02 NOTE — Progress Notes (Signed)
Subjective:     Patient ID: Amanda Wilcox, female   DOB: Aug 20, 1960, 58 y.o.   MRN: [id number]  Amanda Wilcox presents for Abdominal Pain  On and off for a week. Has had some pain like this before, but never this bad. Started in the center upper stomach and would radiate some to back at times.  Reports pain as sharp. Associated with constipation, bloating, cramping, and small infrequent stools. Reports some gas. Denies changes in diet or drinks. Denies alcohol use. Reports trying an enema this morning, some relief but not fully and it came back. Only had small BM with it. Denies blood in stool. Denies diarrhea. Denies n/v. Is taking norco, mobic, provigil, ditropan, and Neurontin and cymbalta. Has had issues with constipation in the past.   Mild increase shortness of breath thinks it is related to mask, pain, and smoking. Friend present with her is more concerned because this happened at home without the mask.    Today patient denies some signs and symptoms of COVID 19 infection including fever, chills, cough, and headache.  Past Medical, Surgical, Social History, Allergies, and Medications have been Reviewed.  Past Medical History:  Diagnosis Date  . Anxiety    Trying to quit smoking - has cut back.  . Arthralgia   . Fatigue   . Neuromuscular disorder (HCC)    fibromyalgia  . Restless leg syndrome    Past Surgical History:  Procedure Laterality Date  . CARPAL TUNNEL RELEASE Right    Social History   Socioeconomic History  . Marital status: Divorced    Spouse name: Not on file  . Number of children: 3  . Years of education: GED  . Highest education level: Not on file  Occupational History  . Occupation: Disability  Social Needs  . Financial resource strain: Not hard at all  . Food insecurity    Worry: Never true    Inability: Never true  . Transportation needs    Medical: No    Non-medical: No  Tobacco Use  . Smoking status: Former Smoker    Packs/day: 0.50     Years: 40.00    Pack years: 20.00    Types: Cigarettes  . Smokeless tobacco: Never Used  Substance and Sexual Activity  . Alcohol use: No    Alcohol/week: 0.0 standard drinks  . Drug use: No  . Sexual activity: Not Currently  Lifestyle  . Physical activity    Days per week: 0 days    Minutes per session: 0 min  . Stress: Only a little  Relationships  . Social Herbalist on phone: Three times a week    Gets together: Twice a week    Attends religious service: Never    Active member of club or organization: No    Attends meetings of clubs or organizations: Never    Relationship status: Divorced  . Intimate partner violence    Fear of current or ex partner: No    Emotionally abused: No    Physically abused: No    Forced sexual activity: No  Other Topics Concern  . Not on file  Social History Narrative   Lives with a Nino Glow  (roommate)   1 dog: Tanko Water engineer mix)    Left-handed.       Daughter Zebedee Iba- 3 grandchildren    Daughter Lattie Haw (in the army)    Son Darnell Level -2 grandchildren       Enjoys spending time with  best friends, game nights, cook outs, spending time with kids and grandchildren      Diet: eggs, pancakes, oatmeal, sausage, chicken, does not get a lot of veggies and fruits like should. Does eat a lot more breads, meats.    Caffeine: 2 c coffee (cream and sugar), will drink some soft drinks/but rare   Water: 4 cups daily      Wears seat belt    Wears sunscreen   Smoke and carbon monoxide detectors   Does not use phone while driving    Outpatient Encounter Medications as of 01/02/2019  Medication Sig  . CHANTIX 1 MG tablet Take 1 mg by mouth daily.  . DULoxetine (CYMBALTA) 60 MG capsule Take 60 mg by mouth 2 (two) times a day.  . gabapentin (NEURONTIN) 300 MG capsule Take 300 mg by mouth 2 (two) times a day.  Marland Kitchen HYDROcodone-acetaminophen (NORCO/VICODIN) 5-325 MG tablet Take 1 tablet by mouth daily as needed for pain.  . meloxicam  (MOBIC) 15 MG tablet Take 15 mg by mouth daily.  . modafinil (PROVIGIL) 200 MG tablet Take 200 mg by mouth daily.  Marland Kitchen oxybutynin (DITROPAN-XL) 5 MG 24 hr tablet Take 5 mg by mouth daily.   No facility-administered encounter medications on file as of 01/02/2019.    No Known Allergies  Review of Systems  Constitutional: Negative for chills and fever.  HENT: Negative.   Eyes: Negative.   Respiratory: Negative.   Cardiovascular: Negative.   Gastrointestinal: Positive for abdominal distention, abdominal pain and constipation. Negative for blood in stool, diarrhea, nausea, rectal pain and vomiting.  Endocrine: Negative.   Genitourinary: Negative.   Musculoskeletal: Negative.   Skin: Negative.   Allergic/Immunologic: Negative.   Neurological: Negative.   Hematological: Negative.   Psychiatric/Behavioral: Negative.   All other systems reviewed and are negative.      Objective:     BP 123/75   Pulse 90   Temp 99.6 F (37.6 C) (Oral)   Resp 12   Ht 5\' 4"  (1.626 m)   Wt 202 lb 0.6 oz (91.6 kg)   SpO2 95%   BMI 34.68 kg/m   Physical Exam Vitals signs and nursing note reviewed.  Constitutional:      Appearance: Normal appearance. She is well-developed and well-groomed. She is obese.  HENT:     Head: Normocephalic and atraumatic.     Right Ear: External ear normal.     Left Ear: External ear normal.     Nose: Nose normal.     Mouth/Throat:     Mouth: Mucous membranes are moist.     Pharynx: Oropharynx is clear.  Eyes:     General:        Right eye: No discharge.        Left eye: No discharge.     Conjunctiva/sclera: Conjunctivae normal.  Neck:     Musculoskeletal: Normal range of motion and neck supple.  Cardiovascular:     Rate and Rhythm: Normal rate and regular rhythm.     Pulses: Normal pulses.     Heart sounds: Normal heart sounds.  Pulmonary:     Effort: Pulmonary effort is normal. No tachypnea.     Breath sounds: Decreased air movement present. Examination of  the right-lower field reveals decreased breath sounds. Examination of the left-lower field reveals decreased breath sounds. Decreased breath sounds present.     Comments: Noted shortness of breath  Abdominal:     General: Abdomen is flat. Bowel sounds are increased.  There is distension. There is no abdominal bruit.     Palpations: There is no shifting dullness, fluid wave, hepatomegaly, splenomegaly, mass or pulsatile mass.     Tenderness: There is generalized abdominal tenderness. There is no right CVA tenderness, left CVA tenderness, guarding or rebound. Negative signs include Murphy's sign, Rovsing's sign, McBurney's sign, psoas sign and obturator sign.     Hernia: No hernia is present.  Musculoskeletal: Normal range of motion.  Skin:    General: Skin is warm.  Neurological:     General: No focal deficit present.     Mental Status: She is alert and oriented to person, place, and time.  Psychiatric:        Attention and Perception: Attention normal.        Mood and Affect: Mood normal.        Speech: Speech normal.        Behavior: Behavior normal. Behavior is cooperative.        Thought Content: Thought content normal.        Cognition and Memory: Cognition normal.        Judgment: Judgment normal.        Assessment and Plan       1. Fever, unspecified fever cause Low grade fever, SHOB, and stomach pains, I am concerned for COVID I will look for testing.  I will also get labs to see if she has any infections.  - COMPLETE METABOLIC PANEL WITH GFR - CBC with Differential/Platelet  2. Generalized abdominal pain Vague description of pain. Seems like constipation is the main cause and  Hx of, plus medications she is on. Seems to be generalized without a true point of cause. With some complains of upper gastric and back-could be pancreatic in nature. Will look to check if treatment of constipation is not cause relief.  3. Shortness of breath Noted SHOB, o2 sat stable. No  exposure to COVD she is aware of. Advised to reduce and stop smoking.  4. Nicotine abuse Asked about quitting: confirms they are currently smokes cigarettes Advise to quit smoking: Educated about QUITTING to reduce the risk of cancer, cardio and cerebrovascular disease. Assess willingness: Unwilling to quit at this time, but is working on cutting back. Assist with counseling: not quitting follow up in 3 months and continue to offer help.   5. Fibromyalgia Needs all medications for tx of this. Might need dose adjustments or referral to GI if on going constipation is a concern Advised for daily stool softener and laxative as needed.  Follow Up: 01/18/2019  Perlie Mayo, DNP, AGNP-BC [place], [address], [place] [address] Office Hours: Mon-Thurs 8 am-5 pm; Fri 8 am-12 pm Office Phone:  [phone]  Office Fax: [phone]

## 2019-01-02 NOTE — Patient Instructions (Signed)
Thank you for coming into the office today. I appreciate the opportunity to provide you with the care for your health and wellness.  Today we discussed: abdomen pain  Follow Up: 2 weeks if not better  Labs today  Go get COVID 19 testing, stay home until results   Follow the clean out below. Can get magnesium citrate if rather instead of miralax Follow directions on bottle  Pick up gas -x to take as needed.  If you get nauseated, start vomiting, or only having liquid stools and never soft or semi formed Stop the medications and then call me.  If you develop increased work of breathing, cough, increased fever (feeling hot and sweaty) or chills, and nausea and vomiting,  Go to the ED.   STAY HYDRATED  Please continue to practice social distancing to keep you, your family, and our community safe.  If you must go out, please wear a Mask and practice good handwashing.  Amanda Wilcox YOUR HANDS WELL AND FREQUENTLY. AVOID TOUCHING YOUR FACE, UNLESS YOUR HANDS ARE FRESHLY WASHED.  GET FRESH AIR DAILY. STAY HYDRATED WITH WATER.   It was a pleasure to see you and I look forward to continuing to work together on your health and well-being. Please do not hesitate to call the office if you need care or have questions about your care.  Have a wonderful day and week.  With Gratitude,  Cherly Beach, DNP, AGNP-BC   You are constipated and need help to clean out the large amount of stool (poop) in the intestine. This guide tells you what medicine to use.  What do I need to know before starting the clean out?   It will take about 4 to 6 hours to take the medicine.   After taking the medicine, you should have a large stool within 24 hours.   Plan to stay close to a bathroom until the stool has passed.  After the intestine is cleaned out, you will need to take a daily medicine.   Remember:  Constipation can last a long time. It may take 6 to 12 months for you to get back to regular  bowel movements (BMs). Be patient. Things will get better slowly over time.  When should you start the clean out?   Start the home clean out on a Friday afternoon or some other time when you will be home (and not at school).   Start between 2:00 and 4:00 in the afternoon.   You should have almost clear liquid stools by the end of the next day.  If the medicine does not work or you dont know if it worked, Statistician.  What medicine do I need to take?  You need to take Miralax, a powder that you mix in a clear liquid.  Follow these steps: ?    Stir the Miralax powder into water, juice, or Gatorade. Your Miralax dose is: ?    Drink 4 to 8 ounces every 30 minutes. It will take 4 to 6 hours to finish the medicine. ?    After the medicine is gone, drink more water or juice. This will help with the cleanout.   -     If the medicine gives you an upset stomach, slow down or stop.  Does I need to keep taking medicine?  Talk with me in the future about this.  You should go to the doctor for follow-up appointments as directed.  What if I get constipated again?  Some people need to have the clean out more than one time for the problem to go away. Contact your doctor to ask if you should repeat the clean out. It is OK to do it again, but you should wait at least a week before repeating the clean out.   Will I have any problems with the medicine?   You may have stomach pain or cramping during the clean out. This might mean you have to go to the bathroom.   Take some time to sit on the toilet. The pain will go away when the stool is gone. You may want to read while you wait. A warm bath may also help.  What should I eat and drink?  Drink lots of water and juice. Fruits and vegetables are good foods to eat. Try to avoid greasy and fatty foods.

## 2019-01-03 ENCOUNTER — Inpatient Hospital Stay (HOSPITAL_COMMUNITY)
Admission: EM | Admit: 2019-01-03 | Discharge: 2019-01-07 | DRG: 373 | Disposition: A | Discharge status: Home / Self Care | Payer: BC Managed Care – PPO | Attending: General Surgery | Admitting: General Surgery

## 2019-01-03 ENCOUNTER — Emergency Department (HOSPITAL_COMMUNITY): Payer: BC Managed Care – PPO

## 2019-01-03 ENCOUNTER — Other Ambulatory Visit: Payer: Self-pay

## 2019-01-03 ENCOUNTER — Encounter (HOSPITAL_COMMUNITY): Payer: Self-pay | Admitting: Emergency Medicine

## 2019-01-03 DIAGNOSIS — M549 Dorsalgia, unspecified: Secondary | ICD-10-CM | POA: Diagnosis present

## 2019-01-03 DIAGNOSIS — Z87891 Personal history of nicotine dependence: Secondary | ICD-10-CM

## 2019-01-03 DIAGNOSIS — M797 Fibromyalgia: Secondary | ICD-10-CM | POA: Diagnosis present

## 2019-01-03 DIAGNOSIS — K352 Acute appendicitis with generalized peritonitis, without abscess: Secondary | ICD-10-CM | POA: Diagnosis not present

## 2019-01-03 DIAGNOSIS — K3532 Acute appendicitis with perforation and localized peritonitis, without abscess: Secondary | ICD-10-CM | POA: Diagnosis not present

## 2019-01-03 DIAGNOSIS — E669 Obesity, unspecified: Secondary | ICD-10-CM | POA: Diagnosis present

## 2019-01-03 DIAGNOSIS — Z20828 Contact with and (suspected) exposure to other viral communicable diseases: Secondary | ICD-10-CM | POA: Diagnosis present

## 2019-01-03 DIAGNOSIS — Z6835 Body mass index (BMI) 35.0-35.9, adult: Secondary | ICD-10-CM

## 2019-01-03 DIAGNOSIS — G8929 Other chronic pain: Secondary | ICD-10-CM | POA: Diagnosis present

## 2019-01-03 DIAGNOSIS — Z823 Family history of stroke: Secondary | ICD-10-CM

## 2019-01-03 LAB — CBC
HCT: 42 % (ref 36.0–46.0)
Hemoglobin: 13.5 g/dL (ref 12.0–15.0)
MCH: 29.2 pg (ref 26.0–34.0)
MCHC: 32.1 g/dL (ref 30.0–36.0)
MCV: 90.9 fL (ref 80.0–100.0)
Platelets: 247 10*3/uL (ref 150–400)
RBC: 4.62 MIL/uL (ref 3.87–5.11)
RDW: 12.8 % (ref 11.5–15.5)
WBC: 14.6 10*3/uL — ABNORMAL HIGH (ref 4.0–10.5)
nRBC: 0 % (ref 0.0–0.2)

## 2019-01-03 LAB — COMPLETE METABOLIC PANEL WITH GFR
AG Ratio: 1.4 (calc) (ref 1.0–2.5)
ALT: 13 U/L (ref 6–29)
AST: 13 U/L (ref 10–35)
Albumin: 4.2 g/dL (ref 3.6–5.1)
Alkaline phosphatase (APISO): 93 U/L (ref 37–153)
BUN: 9 mg/dL (ref 7–25)
CO2: 27 mmol/L (ref 20–32)
Calcium: 9.7 mg/dL (ref 8.6–10.4)
Chloride: 99 mmol/L (ref 98–110)
Creat: 0.8 mg/dL (ref 0.50–1.05)
GFR, Est African American: 95 mL/min/{1.73_m2} (ref >=60)
GFR, Est Non African American: 82 mL/min/{1.73_m2} (ref >=60)
Globulin: 2.9 g/dL (calc) (ref 1.9–3.7)
Glucose, Bld: 114 mg/dL (ref 65–139)
Potassium: 4.2 mmol/L (ref 3.5–5.3)
Sodium: 135 mmol/L (ref 135–146)
Total Bilirubin: 0.4 mg/dL (ref 0.2–1.2)
Total Protein: 7.1 g/dL (ref 6.1–8.1)

## 2019-01-03 LAB — CBC WITH DIFFERENTIAL/PLATELET
Absolute Monocytes: 838 cells/uL (ref 200–950)
Basophils Absolute: 15 cells/uL (ref 0–200)
Basophils Relative: 0.1 %
Eosinophils Absolute: 44 cells/uL (ref 15–500)
Eosinophils Relative: 0.3 %
HCT: 43.8 % (ref 35.0–45.0)
Hemoglobin: 14.7 g/dL (ref 11.7–15.5)
Lymphs Abs: 1617 cells/uL (ref 850–3900)
MCH: 29.6 pg (ref 27.0–33.0)
MCHC: 33.6 g/dL (ref 32.0–36.0)
MCV: 88.1 fL (ref 80.0–100.0)
MPV: 11.6 fL (ref 7.5–12.5)
Monocytes Relative: 5.7 %
Neutro Abs: 12186 cells/uL — ABNORMAL HIGH (ref 1500–7800)
Neutrophils Relative %: 82.9 %
Platelets: 271 10*3/uL (ref 140–400)
RBC: 4.97 10*6/uL (ref 3.80–5.10)
RDW: 12.2 % (ref 11.0–15.0)
Total Lymphocyte: 11 %
WBC: 14.7 10*3/uL — ABNORMAL HIGH (ref 3.8–10.8)

## 2019-01-03 LAB — COMPREHENSIVE METABOLIC PANEL
ALT: 21 U/L (ref 0–44)
AST: 22 U/L (ref 15–41)
Albumin: 3.7 g/dL (ref 3.5–5.0)
Alkaline Phosphatase: 80 U/L (ref 38–126)
Anion gap: 12 (ref 5–15)
BUN: 9 mg/dL (ref 6–20)
CO2: 23 mmol/L (ref 22–32)
Calcium: 8.8 mg/dL — ABNORMAL LOW (ref 8.9–10.3)
Chloride: 97 mmol/L — ABNORMAL LOW (ref 98–111)
Creatinine, Ser: 0.7 mg/dL (ref 0.44–1.00)
GFR calc Af Amer: >60 mL/min (ref >60)
GFR calc non Af Amer: >60 mL/min (ref >60)
Glucose, Bld: 123 mg/dL — ABNORMAL HIGH (ref 70–99)
Potassium: 3.8 mmol/L (ref 3.5–5.1)
Sodium: 132 mmol/L — ABNORMAL LOW (ref 135–145)
Total Bilirubin: 0.6 mg/dL (ref 0.3–1.2)
Total Protein: 7.9 g/dL (ref 6.5–8.1)

## 2019-01-03 LAB — LIPASE: Lipase: 9 U/L (ref 7–60)

## 2019-01-03 LAB — TEST AUTHORIZATION 2

## 2019-01-03 LAB — LIPASE, BLOOD: Lipase: 36 U/L (ref 11–51)

## 2019-01-03 LAB — TEST AUTHORIZATION

## 2019-01-03 LAB — NOVEL CORONAVIRUS, NAA: SARS-CoV-2, NAA: NOT DETECTED

## 2019-01-03 MED ORDER — IOHEXOL 300 MG/ML  SOLN
100.0000 mL | Freq: Once | INTRAMUSCULAR | Status: AC | PRN
  Administered 2019-01-03: 100 mL via INTRAVENOUS

## 2019-01-03 MED ORDER — MORPHINE SULFATE (PF) 4 MG/ML IV SOLN
4.0000 mg | Freq: Once | INTRAVENOUS | Status: AC
  Administered 2019-01-03: 4 mg via INTRAVENOUS
  Filled 2019-01-03: qty 1

## 2019-01-03 MED ORDER — SODIUM CHLORIDE 0.9 % IV BOLUS
1000.0000 mL | Freq: Once | INTRAVENOUS | Status: AC
  Administered 2019-01-03: 1000 mL via INTRAVENOUS

## 2019-01-03 MED ORDER — SODIUM CHLORIDE 0.9% FLUSH
3.0000 mL | Freq: Once | INTRAVENOUS | Status: AC
  Administered 2019-01-03: 3 mL via INTRAVENOUS

## 2019-01-03 MED ORDER — CEFAZOLIN SODIUM-DEXTROSE 2-3 GM-%(50ML) IV SOLR
2.0000 g | Freq: Once | INTRAVENOUS | Status: DC
  Filled 2019-01-03: qty 50

## 2019-01-03 MED ORDER — ONDANSETRON HCL 4 MG/2ML IJ SOLN
4.0000 mg | Freq: Once | INTRAMUSCULAR | Status: AC
  Administered 2019-01-03: 4 mg via INTRAVENOUS
  Filled 2019-01-03: qty 2

## 2019-01-03 NOTE — Progress Notes (Signed)
I have spoke with Amanda Wilcox personally. She is going to the ED as she is not feeling better at all. She has an elevation in her WBC count and would be best served with a stat CT SCAN. I am concerned for SBO, Diverticulitis, of some other inflammatory process. Lipase is stable and so is alkaline phos.  She tried the magnesium citrate clean out, only liquid stools. She is not nauseated , but is in a lot of discomfort.  She has fibromyalgia and chronic back pain, this could be causing some issues, but the elevation of WBC points to some infection or inflammation most likely.

## 2019-01-03 NOTE — ED Provider Notes (Signed)
[facility] EMERGENCY DEPARTMENT Provider Note   CSN: [id number] Arrival date & time: 01/03/19  1750    History   Chief Complaint Chief Complaint  Patient presents with  . Abdominal Pain    HPI Amanda Wilcox is a 58 y.o. female.     58yo female with complaint of abdominal pain x 2 days, onset with generalized abdominal pain, now with worsening periumbilical pain. Pain is constant, nothing makes pain better or worse, no relief with miralax and milk of magnesia. Pt seen by PCP yesterday, had elevated WBC. Reports chills, no documented fevers. Denies constipation, reports loose stools after taking laxities above. No known sick contacts. No nausea or vomiting, no changes in bladder habits. No other complaints or concerns.      Past Medical History:  Diagnosis Date  . Anxiety    Trying to quit smoking - has cut back.  . Arthralgia   . Fatigue   . Neuromuscular disorder (HCC)    fibromyalgia  . Restless leg syndrome     Patient Active Problem List   Diagnosis Date Noted  . Generalized abdominal pain 01/02/2019  . Shortness of breath 01/02/2019  . Nicotine abuse 01/02/2019  . Fibromyalgia 01/02/2019  . Obesity (BMI 35.0-39.9 without comorbidity) 01/02/2019  . Fever 01/02/2019    Past Surgical History:  Procedure Laterality Date  . CARPAL TUNNEL RELEASE Right      OB History   No obstetric history on file.      Home Medications    Prior to Admission medications   Medication Sig Start Date End Date Taking? Authorizing Provider  DULoxetine (CYMBALTA) 60 MG capsule Take 60 mg by mouth 2 (two) times a day. 11/08/18  Yes [provider]  gabapentin (NEURONTIN) 300 MG capsule Take 300 mg by mouth 2 (two) times a day. 11/01/18  Yes [provider]  HYDROcodone-acetaminophen (NORCO/VICODIN) 5-325 MG tablet Take 1 tablet by mouth daily as needed for pain. 11/08/18  Yes [provider]  meloxicam (MOBIC) 15 MG tablet Take 15 mg by mouth daily.  09/15/18  Yes [provider]  modafinil (PROVIGIL) 200 MG tablet Take 200 mg by mouth daily. 10/13/18  Yes [provider]  oxybutynin (DITROPAN-XL) 5 MG 24 hr tablet Take 5 mg by mouth daily. 08/15/18  Yes [provider]    Family History Family History  Problem Relation Age of Onset  . Stroke Mother   . Epilepsy Maternal Aunt   . Multiple sclerosis Maternal Uncle   . Heart disease Maternal Grandfather     Social History Social History   Tobacco Use  . Smoking status: Former Smoker    Packs/day: 0.50    Years: 40.00    Pack years: 20.00    Types: Cigarettes  . Smokeless tobacco: Never Used  Substance Use Topics  . Alcohol use: No    Alcohol/week: 0.0 standard drinks  . Drug use: No     Allergies   Patient has no known allergies.   Review of Systems Review of Systems  Constitutional: Positive for chills. Negative for fever.  Respiratory: Negative for shortness of breath.   Cardiovascular: Negative for chest pain.  Gastrointestinal: Positive for abdominal distention and abdominal pain. Negative for constipation, diarrhea, nausea and vomiting.  Genitourinary: Negative for dysuria, frequency and urgency.  Musculoskeletal: Negative for arthralgias and myalgias.  Skin: Negative for rash and wound.  Allergic/Immunologic: Negative for immunocompromised state.  Neurological: Negative for dizziness and weakness.  Psychiatric/Behavioral: Negative for confusion.  All other systems reviewed and are negative.    Physical Exam Updated Vital Signs BP (!) 143/76 (BP Location: Right Arm)   Pulse 98   Temp 98.6 F (37 C) (Oral)   Resp 18   Ht 5\' 4"  (1.626 m)   Wt 108.9 kg   SpO2 96%   BMI 41.20 kg/m   Physical Exam Vitals signs and nursing note reviewed.  Constitutional:      General: She is not in acute distress.    Appearance: She is well-developed. She is not diaphoretic.  HENT:     Head: Normocephalic and atraumatic.  Cardiovascular:      Rate and Rhythm: Normal rate and regular rhythm.     Heart sounds: Normal heart sounds.  Pulmonary:     Effort: Pulmonary effort is normal.     Breath sounds: Normal breath sounds.  Abdominal:     General: There is distension.     Tenderness: There is generalized abdominal tenderness.  Skin:    General: Skin is warm and dry.     Findings: No erythema or rash.  Neurological:     Mental Status: She is alert and oriented to person, place, and time.  Psychiatric:        Behavior: Behavior normal.      ED Treatments / Results  Labs (all labs ordered are listed, but only abnormal results are displayed) Labs Reviewed  COMPREHENSIVE METABOLIC PANEL - Abnormal; Notable for the following components:      Result Value   Sodium 132 (*)    Chloride 97 (*)    Glucose, Bld 123 (*)    Calcium 8.8 (*)    All other components within normal limits  CBC - Abnormal; Notable for the following components:   WBC 14.6 (*)    All other components within normal limits  LIPASE, BLOOD  URINALYSIS, ROUTINE W REFLEX MICROSCOPIC    EKG None  Radiology No results found.  Procedures Procedures (including critical care time)  Medications Ordered in ED Medications  sodium chloride flush (NS) 0.9 % injection 3 mL (has no administration in time range)  sodium chloride 0.9 % bolus 1,000 mL (has no administration in time range)  ondansetron (ZOFRAN) injection 4 mg (has no administration in time range)  morphine 4 MG/ML injection 4 mg (has no administration in time range)  iohexol (OMNIPAQUE) 300 MG/ML solution 100 mL (has no administration in time range)     Initial Impression / Assessment and Plan / ED Course  I have reviewed the triage vital signs and the nursing notes.  Pertinent labs & imaging results that were available during my care of the patient were reviewed by me and considered in my medical decision making (see chart for details).  Clinical Course as of Jan 02 2201  Tue Jan 03, 2019  2201 58yo female with abdominal pain x 2 days. On exam, mild diffuse tenderness, distended vs girth. WBL 14.6, CMP without significant changes, lipase WNL.  Care signed out to Mitzi Davenport pending CT.   [LM]    Clinical Course User Index [LM] Tacy Learn, PA-C      Final Clinical Impressions(s) / ED Diagnoses   Final diagnoses:  None    ED Discharge Orders    None       Roque Lias 01/03/19 2203    Virgel Manifold, MD 01/05/19 [phone]

## 2019-01-03 NOTE — ED Triage Notes (Addendum)
Patient complaining of upper right abdominal pain x 2 days. Denies vomiting or diarrhea. States she was tested for covid at the testing site but has not resulted yet.

## 2019-01-04 ENCOUNTER — Emergency Department (HOSPITAL_COMMUNITY): Payer: BC Managed Care – PPO

## 2019-01-04 DIAGNOSIS — M797 Fibromyalgia: Secondary | ICD-10-CM | POA: Diagnosis present

## 2019-01-04 DIAGNOSIS — Z87891 Personal history of nicotine dependence: Secondary | ICD-10-CM | POA: Diagnosis not present

## 2019-01-04 DIAGNOSIS — M549 Dorsalgia, unspecified: Secondary | ICD-10-CM | POA: Diagnosis present

## 2019-01-04 DIAGNOSIS — G8929 Other chronic pain: Secondary | ICD-10-CM | POA: Diagnosis present

## 2019-01-04 DIAGNOSIS — Z823 Family history of stroke: Secondary | ICD-10-CM | POA: Diagnosis not present

## 2019-01-04 DIAGNOSIS — Z6835 Body mass index (BMI) 35.0-35.9, adult: Secondary | ICD-10-CM | POA: Diagnosis not present

## 2019-01-04 DIAGNOSIS — Z20828 Contact with and (suspected) exposure to other viral communicable diseases: Secondary | ICD-10-CM | POA: Diagnosis present

## 2019-01-04 DIAGNOSIS — K3532 Acute appendicitis with perforation and localized peritonitis, without abscess: Secondary | ICD-10-CM

## 2019-01-04 DIAGNOSIS — K352 Acute appendicitis with generalized peritonitis, without abscess: Secondary | ICD-10-CM | POA: Diagnosis present

## 2019-01-04 DIAGNOSIS — E669 Obesity, unspecified: Secondary | ICD-10-CM | POA: Diagnosis present

## 2019-01-04 HISTORY — DX: Acute appendicitis with perforation, localized peritonitis, and gangrene, without abscess: K35.32

## 2019-01-04 LAB — CBC WITH DIFFERENTIAL/PLATELET
Abs Immature Granulocytes: 0.05 10*3/uL (ref 0.00–0.07)
Basophils Absolute: 0 10*3/uL (ref 0.0–0.1)
Basophils Relative: 0 %
Eosinophils Absolute: 0.1 10*3/uL (ref 0.0–0.5)
Eosinophils Relative: 1 %
HCT: 40 % (ref 36.0–46.0)
Hemoglobin: 12.7 g/dL (ref 12.0–15.0)
Immature Granulocytes: 0 %
Lymphocytes Relative: 9 %
Lymphs Abs: 1 10*3/uL (ref 0.7–4.0)
MCH: 29.3 pg (ref 26.0–34.0)
MCHC: 31.8 g/dL (ref 30.0–36.0)
MCV: 92.4 fL (ref 80.0–100.0)
Monocytes Absolute: 1 10*3/uL (ref 0.1–1.0)
Monocytes Relative: 8 %
Neutro Abs: 9.5 10*3/uL — ABNORMAL HIGH (ref 1.7–7.7)
Neutrophils Relative %: 82 %
Platelets: 218 10*3/uL (ref 150–400)
RBC: 4.33 MIL/uL (ref 3.87–5.11)
RDW: 12.8 % (ref 11.5–15.5)
WBC: 11.7 10*3/uL — ABNORMAL HIGH (ref 4.0–10.5)
nRBC: 0 % (ref 0.0–0.2)

## 2019-01-04 LAB — BASIC METABOLIC PANEL
Anion gap: 9 (ref 5–15)
BUN: 7 mg/dL (ref 6–20)
CO2: 24 mmol/L (ref 22–32)
Calcium: 8 mg/dL — ABNORMAL LOW (ref 8.9–10.3)
Chloride: 99 mmol/L (ref 98–111)
Creatinine, Ser: 0.75 mg/dL (ref 0.44–1.00)
GFR calc Af Amer: >60 mL/min (ref >60)
GFR calc non Af Amer: >60 mL/min (ref >60)
Glucose, Bld: 125 mg/dL — ABNORMAL HIGH (ref 70–99)
Potassium: 3.5 mmol/L (ref 3.5–5.1)
Sodium: 132 mmol/L — ABNORMAL LOW (ref 135–145)

## 2019-01-04 LAB — SARS CORONAVIRUS 2 BY RT PCR (HOSPITAL ORDER, PERFORMED IN ~~LOC~~ HOSPITAL LAB): SARS Coronavirus 2: NEGATIVE

## 2019-01-04 MED ORDER — KETOROLAC TROMETHAMINE 15 MG/ML IJ SOLN
15.0000 mg | Freq: Four times a day (QID) | INTRAMUSCULAR | Status: DC
  Administered 2019-01-04 – 2019-01-05 (×5): 15 mg via INTRAVENOUS
  Filled 2019-01-04 (×5): qty 1

## 2019-01-04 MED ORDER — DIPHENHYDRAMINE HCL 12.5 MG/5ML PO ELIX: 12.5000 mg | ORAL_SOLUTION | Freq: Four times a day (QID) | ORAL | Status: DC | PRN

## 2019-01-04 MED ORDER — SIMETHICONE 80 MG PO CHEW: 40.0000 mg | CHEWABLE_TABLET | Freq: Four times a day (QID) | ORAL | Status: DC | PRN

## 2019-01-04 MED ORDER — ONDANSETRON HCL 4 MG/2ML IJ SOLN: 4.0000 mg | Freq: Four times a day (QID) | INTRAMUSCULAR | Status: DC | PRN

## 2019-01-04 MED ORDER — OXYCODONE HCL 5 MG PO TABS
5.0000 mg | ORAL_TABLET | ORAL | Status: DC | PRN
  Administered 2019-01-04: 5 mg via ORAL
  Administered 2019-01-05 – 2019-01-06 (×5): 10 mg via ORAL
  Filled 2019-01-04: qty 1
  Filled 2019-01-04 (×5): qty 2

## 2019-01-04 MED ORDER — METOPROLOL TARTRATE 5 MG/5ML IV SOLN: 5.0000 mg | Freq: Four times a day (QID) | INTRAVENOUS | Status: DC | PRN

## 2019-01-04 MED ORDER — DIPHENHYDRAMINE HCL 50 MG/ML IJ SOLN: 12.5000 mg | Freq: Four times a day (QID) | INTRAMUSCULAR | Status: DC | PRN

## 2019-01-04 MED ORDER — HEPARIN SODIUM (PORCINE) 5000 UNIT/ML IJ SOLN
5000.0000 [IU] | Freq: Three times a day (TID) | INTRAMUSCULAR | Status: DC
  Administered 2019-01-04 (×2): 5000 [IU] via SUBCUTANEOUS
  Filled 2019-01-04 (×3): qty 1

## 2019-01-04 MED ORDER — DOCUSATE SODIUM 100 MG PO CAPS
100.0000 mg | ORAL_CAPSULE | Freq: Two times a day (BID) | ORAL | Status: DC
  Administered 2019-01-04 – 2019-01-06 (×7): 100 mg via ORAL
  Filled 2019-01-04 (×8): qty 1

## 2019-01-04 MED ORDER — PIPERACILLIN-TAZOBACTAM 3.375 G IVPB
3.3750 g | Freq: Three times a day (TID) | INTRAVENOUS | Status: DC
  Administered 2019-01-04 – 2019-01-07 (×11): 3.375 g via INTRAVENOUS
  Filled 2019-01-04 (×12): qty 50

## 2019-01-04 MED ORDER — ENOXAPARIN SODIUM 40 MG/0.4ML ~~LOC~~ SOLN
40.0000 mg | SUBCUTANEOUS | Status: DC
  Administered 2019-01-04 – 2019-01-06 (×3): 40 mg via SUBCUTANEOUS
  Filled 2019-01-04 (×3): qty 0.4

## 2019-01-04 MED ORDER — LACTATED RINGERS IV SOLN
INTRAVENOUS | Status: DC
  Administered 2019-01-04 – 2019-01-06 (×5): via INTRAVENOUS

## 2019-01-04 MED ORDER — HYDROMORPHONE HCL 1 MG/ML IJ SOLN
0.5000 mg | Freq: Once | INTRAMUSCULAR | Status: AC
  Administered 2019-01-04: 0.5 mg via INTRAVENOUS
  Filled 2019-01-04: qty 1

## 2019-01-04 MED ORDER — CEFAZOLIN SODIUM-DEXTROSE 2-4 GM/100ML-% IV SOLN
2.0000 g | Freq: Once | INTRAVENOUS | Status: AC
  Administered 2019-01-04: 2 g via INTRAVENOUS
  Filled 2019-01-04 (×2): qty 100

## 2019-01-04 MED ORDER — ACETAMINOPHEN 500 MG PO TABS
1000.0000 mg | ORAL_TABLET | Freq: Four times a day (QID) | ORAL | Status: DC
  Administered 2019-01-04 – 2019-01-07 (×13): 1000 mg via ORAL
  Filled 2019-01-04 (×13): qty 2

## 2019-01-04 MED ORDER — HYDROMORPHONE HCL 1 MG/ML IJ SOLN
0.5000 mg | INTRAMUSCULAR | Status: DC | PRN
  Administered 2019-01-04 – 2019-01-05 (×3): 0.5 mg via INTRAVENOUS
  Filled 2019-01-04 (×3): qty 0.5

## 2019-01-04 MED ORDER — CEFAZOLIN SODIUM-DEXTROSE 2-4 GM/100ML-% IV SOLN
INTRAVENOUS | Status: AC
  Filled 2019-01-04: qty 100

## 2019-01-04 MED ORDER — MORPHINE SULFATE (PF) 2 MG/ML IV SOLN
2.0000 mg | INTRAVENOUS | Status: DC | PRN
  Administered 2019-01-04: 2 mg via INTRAVENOUS
  Filled 2019-01-04: qty 1

## 2019-01-04 MED ORDER — ONDANSETRON HCL 4 MG/2ML IJ SOLN
4.0000 mg | Freq: Once | INTRAMUSCULAR | Status: AC
  Administered 2019-01-04: 4 mg via INTRAVENOUS
  Filled 2019-01-04: qty 2

## 2019-01-04 MED ORDER — ONDANSETRON 4 MG PO TBDP: 4.0000 mg | ORAL_TABLET | Freq: Four times a day (QID) | ORAL | Status: DC | PRN

## 2019-01-04 NOTE — H&P (Signed)
[facility] History and Physical  Reason for Referral: Perforated appendicitis  Referring Physician:  Lily Kocher PA (ED)   Chief Complaint    Abdominal Pain      Amanda Wilcox is a 58 y.o. female.  HPI: Amanda Wilcox is a 58 yo who came to the Ed with a 2 days history of worsening abdominal pain and associated chills. She had generalized abdominal pain that became more periumbilical and now moved to the right lower quadrant and was sharp and constant in nature. The pain progressed and she came to the ED overnight. She denied any nausea, vomiting, or changes in stool and was worked up in the ED and found to have perforated appendicitis.    She has never had a colonoscopy and denies ever having pain like this prior to this episode.  This AM she is still having pain but it is controlled with meds. She was admitted last night to start on IV antibiotics.   Past Medical History:  Diagnosis Date  . Anxiety    Trying to quit smoking - has cut back.  . Arthralgia   . Fatigue   . Neuromuscular disorder (HCC)    fibromyalgia  . Restless leg syndrome     Past Surgical History:  Procedure Laterality Date  . CARPAL TUNNEL RELEASE Right     Family History  Problem Relation Age of Onset  . Stroke Mother   . Epilepsy Maternal Aunt   . Multiple sclerosis Maternal Uncle   . Heart disease Maternal Grandfather     Social History   Tobacco Use  . Smoking status: Former Smoker    Packs/day: 0.50    Years: 40.00    Pack years: 20.00    Types: Cigarettes  . Smokeless tobacco: Never Used  Substance Use Topics  . Alcohol use: No    Alcohol/week: 0.0 standard drinks  . Drug use: No    Medications:  I have reviewed the patient's current medications. Prior to Admission:  Medications Prior to Admission  Medication Sig Dispense Refill Last Dose  . DULoxetine (CYMBALTA) 60 MG capsule Take 60 mg by mouth 2 (two) times a day.   01/03/2019 at Unknown time  . gabapentin  (NEURONTIN) 300 MG capsule Take 300 mg by mouth 2 (two) times a day.   01/03/2019 at Unknown time  . HYDROcodone-acetaminophen (NORCO/VICODIN) 5-325 MG tablet Take 1 tablet by mouth daily as needed for pain.   01/03/2019 at Unknown time  . meloxicam (MOBIC) 15 MG tablet Take 15 mg by mouth daily.   01/03/2019 at Unknown time  . modafinil (PROVIGIL) 200 MG tablet Take 200 mg by mouth daily.   01/03/2019 at Unknown time  . oxybutynin (DITROPAN-XL) 5 MG 24 hr tablet Take 5 mg by mouth daily.   01/02/2019 at Unknown time   Scheduled: . acetaminophen  1,000 mg Oral Q6H  . docusate sodium  100 mg Oral BID  . heparin  5,000 Units Subcutaneous Q8H  . ketorolac  15 mg Intravenous Q6H   Continuous: . lactated ringers 75 mL/hr at 01/04/19 0232  . piperacillin-tazobactam (ZOSYN)  IV 3.375 g (01/04/19 1536)   PXT:GGYIRSWNIOEVOJJ **OR** diphenhydrAMINE, HYDROmorphone (DILAUDID) injection, metoprolol tartrate, ondansetron **OR** ondansetron (ZOFRAN) IV, oxyCODONE, simethicone  No Known Allergies   ROS:  A comprehensive review of systems was negative except for: Constitutional: positive for chills Gastrointestinal: positive for abdominal pain  Blood pressure (!) 101/50, pulse 80, temperature 98.3 F (36.8 C), temperature source Oral, resp. rate 16,  height 5\' 4"  (1.626 m), weight 91.4 kg, SpO2 93 %. Physical Exam Vitals signs reviewed.  Constitutional:      Appearance: She is well-developed.  HENT:     Head: Normocephalic and atraumatic.  Eyes:     Extraocular Movements: Extraocular movements intact.  Cardiovascular:     Rate and Rhythm: Normal rate and regular rhythm.  Pulmonary:     Effort: Pulmonary effort is normal.  Abdominal:     General: There is distension.     Palpations: Abdomen is soft.     Tenderness: There is abdominal tenderness in the right lower quadrant and suprapubic area. There is no guarding or rebound.     Hernia: No hernia is present.  Skin:    General: Skin is warm and  dry.  Neurological:     General: No focal deficit present.     Mental Status: She is alert and oriented to person, place, and time.  Psychiatric:        Mood and Affect: Mood normal.        Behavior: Behavior normal.     Results: Results for orders placed or performed during the hospital encounter of 01/03/19 (from the past 48 hour(s))  Lipase, blood     Status: None   Collection Time: 01/03/19  7:01 PM  Result Value Ref Range   Lipase 36 11 - 51 U/L    Comment: Performed at [facility], [address], [place], [place] [address]  Comprehensive metabolic panel     Status: Abnormal   Collection Time: 01/03/19  7:01 PM  Result Value Ref Range   Sodium 132 (L) 135 - 145 mmol/L   Potassium 3.8 3.5 - 5.1 mmol/L   Chloride 97 (L) 98 - 111 mmol/L   CO2 23 22 - 32 mmol/L   Glucose, Bld 123 (H) 70 - 99 mg/dL   BUN 9 6 - 20 mg/dL   Creatinine, Ser 0.70 0.44 - 1.00 mg/dL   Calcium 8.8 (L) 8.9 - 10.3 mg/dL   Total Protein 7.9 6.5 - 8.1 g/dL   Albumin 3.7 3.5 - 5.0 g/dL   AST 22 15 - 41 U/L   ALT 21 0 - 44 U/L   Alkaline Phosphatase 80 38 - 126 U/L   Total Bilirubin 0.6 0.3 - 1.2 mg/dL   GFR calc non Af Amer >60 >60 mL/min   GFR calc Af Amer >60 >60 mL/min   Anion gap 12 5 - 15    Comment: Performed at [facility], [address] [place], [place] [address]  CBC     Status: Abnormal   Collection Time: 01/03/19  7:01 PM  Result Value Ref Range   WBC 14.6 (H) 4.0 - 10.5 K/uL   RBC 4.62 3.87 - 5.11 MIL/uL   Hemoglobin 13.5 12.0 - 15.0 g/dL   HCT 42.0 36.0 - 46.0 %   MCV 90.9 80.0 - 100.0 fL   MCH 29.2 26.0 - 34.0 pg   MCHC 32.1 30.0 - 36.0 g/dL   RDW 12.8 11.5 - 15.5 %   Platelets 247 150 - 400 K/uL   nRBC 0.0 0.0 - 0.2 %    Comment: Performed at [facility], [address] [place], [place] [address]  SARS Coronavirus 2 [facility] order, Performed in [place] hospital lab) Nasopharyngeal Nasopharyngeal Swab     Status: None   Collection Time: 01/04/19 12:13 AM   Specimen:  Nasopharyngeal Swab  Result Value Ref Range   SARS Coronavirus 2  NEGATIVE NEGATIVE    Comment: (NOTE) If result is NEGATIVE SARS-CoV-2 target nucleic acids are NOT DETECTED. The SARS-CoV-2 RNA is generally detectable in upper and lower  respiratory specimens during the acute phase of infection. The lowest  concentration of SARS-CoV-2 viral copies this assay can detect is 250  copies / mL. A negative result does not preclude SARS-CoV-2 infection  and should not be used as the sole basis for treatment or other  patient management decisions.  A negative result may occur with  improper specimen collection / handling, submission of specimen other  than nasopharyngeal swab, presence of viral mutation(s) within the  areas targeted by this assay, and inadequate number of viral copies  (<250 copies / mL). A negative result must be combined with clinical  observations, patient history, and epidemiological information. If result is POSITIVE SARS-CoV-2 target nucleic acids are DETECTED. The SARS-CoV-2 RNA is generally detectable in upper and lower  respiratory specimens dur ing the acute phase of infection.  Positive  results are indicative of active infection with SARS-CoV-2.  Clinical  correlation with patient history and other diagnostic information is  necessary to determine patient infection status.  Positive results do  not rule out bacterial infection or co-infection with other viruses. If result is PRESUMPTIVE POSTIVE SARS-CoV-2 nucleic acids MAY BE PRESENT.   A presumptive positive result was obtained on the submitted specimen  and confirmed on repeat testing.  While 2019 novel coronavirus  (SARS-CoV-2) nucleic acids may be present in the submitted sample  additional confirmatory testing may be necessary for epidemiological  and / or clinical management purposes  to differentiate between  SARS-CoV-2 and other Sarbecovirus currently known to infect humans.  If clinically indicated  additional testing with an alternate test  methodology [phone]) is advised. The SARS-CoV-2 RNA is generally  detectable in upper and lower respiratory sp ecimens during the acute  phase of infection. The expected result is Negative. Fact Sheet for Patients:  StrictlyIdeas.no Fact Sheet for Healthcare Providers: BankingDealers.co.za This test is not yet approved or cleared by the [place] FDA and has been authorized for detection and/or diagnosis of SARS-CoV-2 by FDA under an Emergency Use Authorization (EUA).  This EUA will remain in effect (meaning this test can be used) for the duration of the COVID-19 declaration under Section 564(b)(1) of the Act, 21 U.S.C. section 360bbb-3(b)(1), unless the authorization is terminated or revoked sooner. Performed at [facility], [address], [place], [place] [address]   CBC WITH DIFFERENTIAL     Status: Abnormal   Collection Time: 01/04/19  4:07 AM  Result Value Ref Range   WBC 11.7 (H) 4.0 - 10.5 K/uL   RBC 4.33 3.87 - 5.11 MIL/uL   Hemoglobin 12.7 12.0 - 15.0 g/dL   HCT 40.0 36.0 - 46.0 %   MCV 92.4 80.0 - 100.0 fL   MCH 29.3 26.0 - 34.0 pg   MCHC 31.8 30.0 - 36.0 g/dL   RDW 12.8 11.5 - 15.5 %   Platelets 218 150 - 400 K/uL   nRBC 0.0 0.0 - 0.2 %   Neutrophils Relative % 82 %   Neutro Abs 9.5 (H) 1.7 - 7.7 K/uL   Lymphocytes Relative 9 %   Lymphs Abs 1.0 0.7 - 4.0 K/uL   Monocytes Relative 8 %   Monocytes Absolute 1.0 0.1 - 1.0 K/uL   Eosinophils Relative 1 %   Eosinophils Absolute 0.1 0.0 - 0.5 K/uL   Basophils Relative 0 %   Basophils  Absolute 0.0 0.0 - 0.1 K/uL   Immature Granulocytes 0 %   Abs Immature Granulocytes 0.05 0.00 - 0.07 K/uL    Comment: Performed at [facility], [address] [place], [place] [address]  Basic metabolic panel     Status: Abnormal   Collection Time: 01/04/19  4:07 AM  Result Value Ref Range   Sodium 132 (L) 135 - 145 mmol/L   Potassium 3.5  3.5 - 5.1 mmol/L   Chloride 99 98 - 111 mmol/L   CO2 24 22 - 32 mmol/L   Glucose, Bld 125 (H) 70 - 99 mg/dL   BUN 7 6 - 20 mg/dL   Creatinine, Ser 0.75 0.44 - 1.00 mg/dL   Calcium 8.0 (L) 8.9 - 10.3 mg/dL   GFR calc non Af Amer >60 >60 mL/min   GFR calc Af Amer >60 >60 mL/min   Anion gap 9 5 - 15    Comment: Performed at [facility], [address] [place], [place] [address]   Personally reviewed CT- dilated appendix, significant stranding and inflammation the entire length, appendicolith, base looks thickened, dots of air around, looks like appendix is likely necrotic given the microperfs   Ct Abdomen Pelvis W Contrast  Result Date: 01/03/2019 CLINICAL DATA:  58 year old female with right lower quadrant abdominal pain. EXAM: CT ABDOMEN AND PELVIS WITH CONTRAST TECHNIQUE: Multidetector CT imaging of the abdomen and pelvis was performed using the standard protocol following bolus administration of intravenous contrast. CONTRAST:  124mL OMNIPAQUE IOHEXOL 300 MG/ML  SOLN COMPARISON:  None. FINDINGS: Lower chest: Mild emphysema of the visualized lung bases. The visualized lung bases are otherwise clear. No intra-abdominal free air. There is trace free fluid within the pelvis. Hepatobiliary: Apparent diffuse fatty infiltration of the liver. No intrahepatic biliary ductal dilatation. The gallbladder is unremarkable. Pancreas: There is mild prominence of the main pancreatic duct measuring up to 4 mm of indeterminate etiology. MRCP may provide better evaluation on a nonemergent basis to exclude a central occlusive lesion. No active inflammatory changes. No fluid collection. Spleen: Normal in size without focal abnormality. Adrenals/Urinary Tract: The adrenal glands, kidneys, and urinary bladder appear unremarkable. Mild right hydroureter, likely reactive to inflammatory changes of the right hemipelvis. Stomach/Bowel: There is loose stool throughout the colon. There is no bowel obstruction. There is severe  inflammatory changes of the appendix with probable several punctate extraluminal air concerning for focally contained microperforation. Several faint stones noted in the distal appendix. The appendix is located in the right hemipelvis extends from the posterior aspect of the cecum posteriorly with the tip of the appendix extending deep into the posterior pelvis. There is diffuse inflammatory changes of the peritoneal fat. The appendix measures approximately 2.3 cm in diameter. There is enhancement of the appendiceal mucosa. No drainable fluid collection or abscess identified. Vascular/Lymphatic: Mild atherosclerotic calcification of the aorta. The IVC is unremarkable. No portal venous gas. There is no adenopathy. Reproductive: The uterus is anteverted and grossly unremarkable. The ovaries are unremarkable as visualized. Other: Small fat containing umbilical hernia Musculoskeletal: No acute or significant osseous findings. IMPRESSION: 1. Acute appendicitis with findings concerning for focally contained microperforation. No drainable fluid collection or abscess. 2. No bowel obstruction. 3. Mildly dilated main pancreatic duct. MRCP may provide better evaluation on a nonemergent basis. 4. Aortic Atherosclerosis (ICD10-I70.0). Electronically Signed   By: Anner Crete M.D.   On: 01/03/2019 23:07   Dg Chest Portable 1 View  Result Date: 01/04/2019 CLINICAL DATA:  58 year old female with acute  appendicitis. EXAM: PORTABLE CHEST 1 VIEW COMPARISON:  None. FINDINGS: The heart size and mediastinal contours are within normal limits. Both lungs are clear. The visualized skeletal structures are unremarkable. IMPRESSION: No active disease. Electronically Signed   By: Anner Crete M.D.   On: 01/04/2019 00:45    Assessment & Plan:  Amanda Wilcox is a 58 y.o. female with perforated appendicitis on CT scan. She is doing fair.  -Admit  -PRN For pain, toradol scheduled and tylenol scheduled  -IS, OOB  -HD ok -NPO  with sips and meds -Labs ordered, leukocytosis down  -Zosyn for appendicitis  -SCDs, will change heparin to lovenox daily - Will monitor for worsening pain or infection, may need IR drainage -Plan for interval appendectomy and colonoscopy prior given no prior one   Updated daughter Donella Stade, [phone]   All questions were answered to the satisfaction of the patient and family.  Virl Cagey 01/04/2019, 7:59 PM

## 2019-01-04 NOTE — Progress Notes (Signed)
[facility]  Patient with acute appendicitis with microperforation per report. Will need to review scan in AM and discuss with patient. With perforation we typically start with a none operative approach. Will determine final plan in the AM.  PRN For pain NPO Zosyn for appendiciti.  Curlene Labrum, MD [facility] [address] [place], [place] [address] [phone] (office)

## 2019-01-04 NOTE — ED Provider Notes (Signed)
CONTINUING CARE FROM L MURPHY, PA-C.  Received patient at shift change.  Patient is a 58 year old female with 2-day history of mostly generalized abdominal pain, but gradually worsening in the periumbilical area.  The patient does not recall fever or chills, but did not have relief from MiraLAX and milk of magnesia.  The patient was then seen by the primary care physician on yesterday, August 10.  Patient was noted to have elevated white blood cell count, and was advised to come to the emergency department for evaluation.  Work-up is in progress.  Patient has an elevation in the white blood cell count of 14,600.  There is no noted shift to the left.  CT scan pending.  CT scan shows an acute appendicitis.  There were also findings consistent with focal contained microperforations.  There was no abscess, or drainable fluid collection noted.  There was also noted some mildly dilated main pancreatic duct.  I have advised the patient of the findings.  Patient started on antibiotics.  Patient requesting additional pain medication.  Dilaudid 5 mg IV given to the patient.  Will consult with surgery.  Case discussed with Dr Constance Haw. She will admit patient to the hospital.   Lily Kocher, PA-C 01/04/19 Karmen Bongo, MD 01/05/19 [phone]

## 2019-01-05 LAB — URINALYSIS, ROUTINE W REFLEX MICROSCOPIC
Bilirubin Urine: NEGATIVE
Glucose, UA: NEGATIVE mg/dL
Hgb urine dipstick: NEGATIVE
Ketones, ur: NEGATIVE mg/dL
Nitrite: NEGATIVE
Protein, ur: 30 mg/dL — AB
Specific Gravity, Urine: 1.031 — ABNORMAL HIGH (ref 1.005–1.030)
pH: 5 (ref 5.0–8.0)

## 2019-01-05 LAB — CBC WITH DIFFERENTIAL/PLATELET
Abs Immature Granulocytes: 0.03 10*3/uL (ref 0.00–0.07)
Basophils Absolute: 0 10*3/uL (ref 0.0–0.1)
Basophils Relative: 0 %
Eosinophils Absolute: 0 10*3/uL (ref 0.0–0.5)
Eosinophils Relative: 0 %
HCT: 40.1 % (ref 36.0–46.0)
Hemoglobin: 12.6 g/dL (ref 12.0–15.0)
Immature Granulocytes: 0 %
Lymphocytes Relative: 7 %
Lymphs Abs: 0.5 10*3/uL — ABNORMAL LOW (ref 0.7–4.0)
MCH: 29.2 pg (ref 26.0–34.0)
MCHC: 31.4 g/dL (ref 30.0–36.0)
MCV: 93 fL (ref 80.0–100.0)
Monocytes Absolute: 0.3 10*3/uL (ref 0.1–1.0)
Monocytes Relative: 5 %
Neutro Abs: 6.7 10*3/uL (ref 1.7–7.7)
Neutrophils Relative %: 88 %
Platelets: 221 10*3/uL (ref 150–400)
RBC: 4.31 MIL/uL (ref 3.87–5.11)
RDW: 12.5 % (ref 11.5–15.5)
WBC Morphology: INCREASED
WBC: 7.6 10*3/uL (ref 4.0–10.5)
nRBC: 0 % (ref 0.0–0.2)

## 2019-01-05 LAB — BASIC METABOLIC PANEL
Anion gap: 10 (ref 5–15)
BUN: 16 mg/dL (ref 6–20)
CO2: 23 mmol/L (ref 22–32)
Calcium: 8 mg/dL — ABNORMAL LOW (ref 8.9–10.3)
Chloride: 98 mmol/L (ref 98–111)
Creatinine, Ser: 1.38 mg/dL — ABNORMAL HIGH (ref 0.44–1.00)
GFR calc Af Amer: 49 mL/min — ABNORMAL LOW (ref >60)
GFR calc non Af Amer: 42 mL/min — ABNORMAL LOW (ref >60)
Glucose, Bld: 97 mg/dL (ref 70–99)
Potassium: 4.3 mmol/L (ref 3.5–5.1)
Sodium: 131 mmol/L — ABNORMAL LOW (ref 135–145)

## 2019-01-05 LAB — HIV ANTIBODY (ROUTINE TESTING W REFLEX): HIV Screen 4th Generation wRfx: NONREACTIVE

## 2019-01-05 MED ORDER — ALUM & MAG HYDROXIDE-SIMETH 200-200-20 MG/5ML PO SUSP
30.0000 mL | ORAL | Status: DC | PRN
  Administered 2019-01-05: 30 mL via ORAL
  Filled 2019-01-05: qty 30

## 2019-01-05 NOTE — Progress Notes (Signed)
Pt requesting prn pain medication. Pt falls asleep while nurse in room. Informed pt that bp is soft and unable to give PRN pain mgmt at this time. Will continue to monitor. Scheduled pain mgt can be given at 0500. Pt understanding at this time

## 2019-01-05 NOTE — Progress Notes (Addendum)
I was present with the medical student for this service. I personally verified the history of present illness, performed the physical exam, and made the plan for this encounter. I have verified the medical student's documentation and made modifications where appropriately. I have personally documented in my own words a brief history, physical, and plan below.     Cr rising, could be delay from CT versus toradol. Stopped toradol, and increased fluids. No nausea, clear diet given to the patient. Told to go slowly.   Labs in AM Monitor  Zosyn for perforated appendicitis   Amanda Labrum, MD [facility] [place], [place] [address] [phone] (office)    [facility] Progress Note     Subjective: [name] is a 58 y.o female with a history of fibromyalgia, chronic back pain who presented to the ED with 2 days of worsening abdominal pain and chills with an appendicitis. CT shows acute appendicitis with findings concerning for focally contained microperforation with no drainable fluid collection or abscess. She currently does not have any nausea, vomiting. She has some diarrhea, no melena or blood in stool. She still has 6/10 pain that is constant and in her upper right quadrant and suprapubic area. On exam she is in pain when talking to me and it is painful for her to move from sitting to laying down. She has no fever, chills but endorses night sweats. No shortness of breath or chest pain.   Objective: Vital signs in last 24 hours: Temp:  [98 F (36.7 C)-99.2 F (37.3 C)] 98 F (36.7 C) (08/13 0550) Pulse Rate:  [72-89] 72 (08/13 0550) Resp:  [16-20] 17 (08/13 0550) BP: (81-129)/(48-68) 94/55 (08/13 0550) SpO2:  [90 %-96 %] 94 % (08/13 0550) Last BM Date: 12/31/18  Intake/Output from previous day: 08/12 0701 - 08/13 0700 In: 1080.5 [I.V.:980.5; IV Piggyback:100] Out: -  Intake/Output this shift: No  intake/output data recorded.   Physical Exam Vitals signs reviewed.  Constitutional:      Appearance: She is well-developed. Slight distress. Sitting in bed.  HENT:     Head: Normocephalic and atraumatic.  Eyes:     Extraocular Movements: Extraocular movements intact.  Cardiovascular:     Rate and Rhythm: Normal rate and regular rhythm.  Pulmonary:     Effort: Pulmonary effort is normal.  Abdominal:     General: There is distension.     Palpations: Abdomen is soft.     Tenderness: There is abdominal tenderness in the right lower quadrant and suprapubic area. There is no guarding or rebound.     Hernia: No hernia is present.  Skin:    General: Skin is warm and dry.  Neurological:     General: No focal deficit present.     Mental Status: She is alert and oriented to person, place, and time.  Psychiatric:        Mood and Affect: Mood normal.        Behavior: Behavior normal.   . Lab Results:  Recent Labs    01/04/19 0407 01/05/19 0353  WBC 11.7* 7.6  HGB 12.7 12.6  HCT 40.0 40.1  PLT 218 221   BMET Recent Labs    01/04/19 0407 01/05/19 0353  NA 132* 131*  K 3.5 4.3  CL 99 98  CO2 24 23  GLUCOSE 125* 97  BUN 7 16  CREATININE 0.75 1.38*  CALCIUM 8.0* 8.0*    Studies/Results: Ct Abdomen Pelvis W Contrast  Result Date: 01/03/2019 CLINICAL DATA:  58 year old female with right lower quadrant abdominal pain. EXAM: CT ABDOMEN AND PELVIS WITH CONTRAST TECHNIQUE: Multidetector CT imaging of the abdomen and pelvis was performed using the standard protocol following bolus administration of intravenous contrast. CONTRAST:  141mL OMNIPAQUE IOHEXOL 300 MG/ML  SOLN COMPARISON:  None. FINDINGS: Lower chest: Mild emphysema of the visualized lung bases. The visualized lung bases are otherwise clear. No intra-abdominal free air. There is trace free fluid within the pelvis. Hepatobiliary: Apparent diffuse fatty infiltration of the liver. No intrahepatic biliary ductal dilatation. The  gallbladder is unremarkable. Pancreas: There is mild prominence of the main pancreatic duct measuring up to 4 mm of indeterminate etiology. MRCP may provide better evaluation on a nonemergent basis to exclude a central occlusive lesion. No active inflammatory changes. No fluid collection. Spleen: Normal in size without focal abnormality. Adrenals/Urinary Tract: The adrenal glands, kidneys, and urinary bladder appear unremarkable. Mild right hydroureter, likely reactive to inflammatory changes of the right hemipelvis. Stomach/Bowel: There is loose stool throughout the colon. There is no bowel obstruction. There is severe inflammatory changes of the appendix with probable several punctate extraluminal air concerning for focally contained microperforation. Several faint stones noted in the distal appendix. The appendix is located in the right hemipelvis extends from the posterior aspect of the cecum posteriorly with the tip of the appendix extending deep into the posterior pelvis. There is diffuse inflammatory changes of the peritoneal fat. The appendix measures approximately 2.3 cm in diameter. There is enhancement of the appendiceal mucosa. No drainable fluid collection or abscess identified. Vascular/Lymphatic: Mild atherosclerotic calcification of the aorta. The IVC is unremarkable. No portal venous gas. There is no adenopathy. Reproductive: The uterus is anteverted and grossly unremarkable. The ovaries are unremarkable as visualized. Other: Small fat containing umbilical hernia Musculoskeletal: No acute or significant osseous findings. IMPRESSION: 1. Acute appendicitis with findings concerning for focally contained microperforation. No drainable fluid collection or abscess. 2. No bowel obstruction. 3. Mildly dilated main pancreatic duct. MRCP may provide better evaluation on a nonemergent basis. 4. Aortic Atherosclerosis (ICD10-I70.0). Electronically Signed   By: Anner Crete M.D.   On: 01/03/2019 23:07   Dg  Chest Portable 1 View  Result Date: 01/04/2019 CLINICAL DATA:  58 year old female with acute appendicitis. EXAM: PORTABLE CHEST 1 VIEW COMPARISON:  None. FINDINGS: The heart size and mediastinal contours are within normal limits. Both lungs are clear. The visualized skeletal structures are unremarkable. IMPRESSION: No active disease. Electronically Signed   By: Anner Crete M.D.   On: 01/04/2019 00:45    Anti-infectives: Anti-infectives (From admission, onward)   Start     Dose/Rate Route Frequency Ordered Stop   01/04/19 0200  ceFAZolin (ANCEF) IVPB 2g/100 mL premix     2 g 200 mL/hr over 30 Minutes Intravenous  Once 01/04/19 0147 01/04/19 0302   01/04/19 0030  piperacillin-tazobactam (ZOSYN) IVPB 3.375 g     3.375 g 12.5 mL/hr over 240 Minutes Intravenous Every 8 hours 01/04/19 0023     01/04/19 0000  ceFAZolin (ANCEF) IVPB 2 g/50 mL premix  Status:  Discontinued     2 g 100 mL/hr over 30 Minutes Intravenous  Once 01/03/19 2350 01/04/19 0146      Assessment/Plan: [name] is a 58 y.o female with a history of fibromyalgia, chronic back pain who presented to the ED with 2 days of worsening abdominal pain and chills with an appendicitis. No fever, hemodynamically stable. Her Cr has elevated to 1.38 from .  48 yesterday.  Plan:  - Nonoperative management with IV antibiotics for acute perforated appendcitis  - Interval appendectomy and colonoscopy as outpatient in 6-8 weeks - Cto monitor for worsening pain or infection/need for IR drainage  Neuro: PRN acetominophen, hydromorphone and oxycodone for pain  Resp: incentive spirometry, out of bed GI: diet: clears, docusate BID, zofran PRN, mylicon PRN  GU: replete electrolytes as necessary, Cr elevated from .75 to 1.38, stopped toradol, stop NSAIDS, lactated ringers 155mL/hr  Heme: H/H stable, no longer leukocytosis on pip-tazo IV  Porphylaxis: SCD, lovenox     LOS: 1 day    Mai-Tram Riquier] 01/05/2019

## 2019-01-06 LAB — BASIC METABOLIC PANEL
Anion gap: 10 (ref 5–15)
BUN: 17 mg/dL (ref 6–20)
CO2: 24 mmol/L (ref 22–32)
Calcium: 8 mg/dL — ABNORMAL LOW (ref 8.9–10.3)
Chloride: 96 mmol/L — ABNORMAL LOW (ref 98–111)
Creatinine, Ser: 0.94 mg/dL (ref 0.44–1.00)
GFR calc Af Amer: >60 mL/min (ref >60)
GFR calc non Af Amer: >60 mL/min (ref >60)
Glucose, Bld: 94 mg/dL (ref 70–99)
Potassium: 3.7 mmol/L (ref 3.5–5.1)
Sodium: 130 mmol/L — ABNORMAL LOW (ref 135–145)

## 2019-01-06 LAB — CBC WITH DIFFERENTIAL/PLATELET
Abs Immature Granulocytes: 0.09 10*3/uL — ABNORMAL HIGH (ref 0.00–0.07)
Basophils Absolute: 0 10*3/uL (ref 0.0–0.1)
Basophils Relative: 0 %
Eosinophils Absolute: 0.2 10*3/uL (ref 0.0–0.5)
Eosinophils Relative: 2 %
HCT: 36.4 % (ref 36.0–46.0)
Hemoglobin: 11.4 g/dL — ABNORMAL LOW (ref 12.0–15.0)
Immature Granulocytes: 1 %
Lymphocytes Relative: 8 %
Lymphs Abs: 0.8 10*3/uL (ref 0.7–4.0)
MCH: 29.2 pg (ref 26.0–34.0)
MCHC: 31.3 g/dL (ref 30.0–36.0)
MCV: 93.1 fL (ref 80.0–100.0)
Monocytes Absolute: 0.3 10*3/uL (ref 0.1–1.0)
Monocytes Relative: 3 %
Neutro Abs: 8.6 10*3/uL — ABNORMAL HIGH (ref 1.7–7.7)
Neutrophils Relative %: 86 %
Platelets: 224 10*3/uL (ref 150–400)
RBC: 3.91 MIL/uL (ref 3.87–5.11)
RDW: 12.9 % (ref 11.5–15.5)
WBC: 9.9 10*3/uL (ref 4.0–10.5)
nRBC: 0 % (ref 0.0–0.2)

## 2019-01-06 NOTE — Progress Notes (Addendum)
I was present with the medical student for this service. I personally verified the history of present illness, performed the physical exam, and made the plan for this encounter. I have verified the medical student's documentation and made modifications where appropriately. I have personally documented in my own words a brief history, physical, and plan below.     Tolerating liquids and some pudding. Wants more to eat. No nausea, having loose BMs.  Cr improved, UOP good.  Zosyn until discharge.  Home tomorrow with augment for 11 more days.  Amanda Labrum, MD [facility] [address] [place], [place] [address] [phone] (office)     [facility] Progress Note     Subjective: [name] is a 58 y.o female with a history of fibromyalgia, chronic back pain who presented to the ED with 2 days of worsening abdominal pain and chills with an appendicitis. Currently she states that her pain is 4/10 and less than yesterday. Last night she did not have any nausea or vomiting. She has not had any hematochezia or melena. No fevers, chills or night sweats. She states that she feels better overall and does not seem distressed on exam. No urinary symptoms.   Objective: Vital signs in last 24 hours: Temp:  [97.8 F (36.6 C)-98.4 F (36.9 C)] 97.8 F (36.6 C) (08/14 0544) Pulse Rate:  [73-80] 80 (08/14 0544) Resp:  [16-18] 18 (08/14 0544) BP: (99-120)/(55-75) 120/75 (08/14 0544) SpO2:  [91 %-97 %] 97 % (08/14 0544) Last BM Date: 01/05/19  Intake/Output from previous day: 08/13 0701 - 08/14 0700 In: 2480 [P.O.:1030; I.V.:1400; IV Piggyback:50] Out: 100 [Urine:100] Intake/Output this shift: Total I/O In: 1991.6 [I.V.:1991.6] Out: -   Physical Exam Vitals signsreviewed.  Constitutional:  Appearance: She is well-developed. Slight distress. Sitting in bed.  HENT:  Head: Normocephalicand atraumatic.  Eyes:   Extraocular Movements: Extraocular movements intact.  Cardiovascular:  Rate and Rhythm: Normal rateand regular rhythm.  Pulmonary:  Effort: Pulmonary effort is normal.  Abdominal:  General: Less distension from yesterday.  Palpations: Abdomen is soft.  Tenderness: There is some abdominal tendernessin the right lower quadrantand suprapubic area. There is noguardingor rebound.  Hernia: No herniais present.  Skin: General: Skin is warmand dry.  Neurological:  General: No focal deficitpresent.  Mental Status: She is alertand oriented to person, place, and time.  Psychiatric:  Mood and Affect: Moodnormal.  Behavior: Behaviornormal.   Lab Results:  Recent Labs    01/05/19 0353 01/06/19 0531  WBC 7.6 9.9  HGB 12.6 11.4*  HCT 40.1 36.4  PLT 221 224   BMET Recent Labs    01/05/19 0353 01/06/19 0531  NA 131* 130*  K 4.3 3.7  CL 98 96*  CO2 23 24  GLUCOSE 97 94  BUN 16 17  CREATININE 1.38* 0.94  CALCIUM 8.0* 8.0*    Anti-infectives: Anti-infectives (From admission, onward)   Start     Dose/Rate Route Frequency Ordered Stop   01/04/19 0200  ceFAZolin (ANCEF) IVPB 2g/100 mL premix     2 g 200 mL/hr over 30 Minutes Intravenous  Once 01/04/19 0147 01/04/19 0302   01/04/19 0030  piperacillin-tazobactam (ZOSYN) IVPB 3.375 g     3.375 g 12.5 mL/hr over 240 Minutes Intravenous Every 8 hours 01/04/19 0023     01/04/19 0000  ceFAZolin (ANCEF) IVPB 2 g/50 mL premix  Status:  Discontinued     2 g 100 mL/hr over 30 Minutes Intravenous  Once 01/03/19 2350 01/04/19  0146      Assessment/Plan: [name] is a 58 y.o female with a history of fibromyalgia, chronic back pain who presented to the ED with 2 days of worsening abdominal pain and chills with an appendicitis. No fever, hemodynamically stable with improved blood pressures from 97-673 systolic. She is feeling better today with pain 4/10 and less tenderness on  abdominal exam. She should stay another day to monitor pain and recheck labs.   Plan:  - Nonoperative management with IV antibiotics for acute perforated appendcitis  - Interval appendectomy and colonoscopy as outpatient in 6-8 weeks - Monitor for worsening pain or infection/need for IR drainage   Neuro: PRN acetominophen, hydromorphone and oxycodone for pain  Resp: incentive spirometry, out of bed GI: diet: clears, docusate BID, zofran PRN, mylicon PRN  GU: replete electrolytes as necessary, Cr normal at .94 down from 1.38, U/A from 8/13 showed 30 protein, stopped toradol and no NSAIDS, lactated ringers 174mL/hr  Heme: H/H dropped from 12.6 to 11.4, no hematochezia or melena, hemodynamically stable, recheck H/H, no longer leukocytosis (WBC did increased from 7.6 to 9.9 today and neutrophils slightly elevated at 8.6 from 6.7), no fevers on pip-tazo IV  Porphylaxis: SCD, lovenox    LOS: 2 days    Mai-Tram Riquier] 01/06/2019

## 2019-01-07 LAB — BASIC METABOLIC PANEL
Anion gap: 11 (ref 5–15)
BUN: 13 mg/dL (ref 6–20)
CO2: 27 mmol/L (ref 22–32)
Calcium: 8.5 mg/dL — ABNORMAL LOW (ref 8.9–10.3)
Chloride: 98 mmol/L (ref 98–111)
Creatinine, Ser: 0.87 mg/dL (ref 0.44–1.00)
GFR calc Af Amer: >60 mL/min (ref >60)
GFR calc non Af Amer: >60 mL/min (ref >60)
Glucose, Bld: 92 mg/dL (ref 70–99)
Potassium: 4.1 mmol/L (ref 3.5–5.1)
Sodium: 136 mmol/L (ref 135–145)

## 2019-01-07 LAB — CBC WITH DIFFERENTIAL/PLATELET
Abs Immature Granulocytes: 0.21 10*3/uL — ABNORMAL HIGH (ref 0.00–0.07)
Basophils Absolute: 0 10*3/uL (ref 0.0–0.1)
Basophils Relative: 0 %
Eosinophils Absolute: 0.2 10*3/uL (ref 0.0–0.5)
Eosinophils Relative: 2 %
HCT: 39.4 % (ref 36.0–46.0)
Hemoglobin: 12.4 g/dL (ref 12.0–15.0)
Immature Granulocytes: 2 %
Lymphocytes Relative: 8 %
Lymphs Abs: 1 10*3/uL (ref 0.7–4.0)
MCH: 29.2 pg (ref 26.0–34.0)
MCHC: 31.5 g/dL (ref 30.0–36.0)
MCV: 92.7 fL (ref 80.0–100.0)
Monocytes Absolute: 0.5 10*3/uL (ref 0.1–1.0)
Monocytes Relative: 4 %
Neutro Abs: 10 10*3/uL — ABNORMAL HIGH (ref 1.7–7.7)
Neutrophils Relative %: 84 %
Platelets: 300 10*3/uL (ref 150–400)
RBC: 4.25 MIL/uL (ref 3.87–5.11)
RDW: 13.1 % (ref 11.5–15.5)
WBC: 11.9 10*3/uL — ABNORMAL HIGH (ref 4.0–10.5)
nRBC: 0 % (ref 0.0–0.2)

## 2019-01-07 MED ORDER — AMOXICILLIN-POT CLAVULANATE 875-125 MG PO TABS: 1.0000 | ORAL_TABLET | Freq: Two times a day (BID) | ORAL | 0 refills | Status: AC

## 2019-01-07 MED ORDER — ONDANSETRON 4 MG PO TBDP: 4.0000 mg | ORAL_TABLET | Freq: Four times a day (QID) | ORAL | 0 refills | Status: DC | PRN

## 2019-01-07 MED ORDER — DOCUSATE SODIUM 100 MG PO CAPS: 100.0000 mg | ORAL_CAPSULE | Freq: Two times a day (BID) | ORAL | 2 refills | Status: DC

## 2019-01-07 NOTE — Discharge Instructions (Signed)
Plan for outpatient colonoscopy and laparoscopic appendectomy in 6-8 weeks after healing.   Monitor for signs of worsening infection, abscess formation like fever, chills, worsening pain. Continue to stay hydrated, drinking is more important than eating. If you do not have an appetite use Boost/ Ensure to supplement for protein calories.   If you use ibuprofen, do not use it excessively. You had a rise in your creatinine (kidney levels) likely related to NSAIDs.   Take the antibiotic as prescribed, and take the complete course.   Medication: Take tylenol and ibuprofen as needed for pain control, alternating every 4-6 hours.  Take Home Norco for pain. Since you are in a pain contract I cannot prescribe more. Call Dr. Florene Glen Monday to discuss this with her so she can possibly increase your dose temporarily.  Take Colace for constipation related to narcotic pain medication. If you do not have a bowel movement in 2 days, take Miralax over the counter.  Drink plenty of water to also prevent constipation.   Contact Information: If you have questions or concerns, please call our office, [phone], Monday- Thursday 8AM-5PM and Friday 8AM-12Noon.  If it is after hours or on the weekend, please call [facility] Main Number, [phone], and ask to speak to the surgeon on call for Dr. Constance Haw at [facility].    Appendicitis, Adult  The appendix is a tube in the body that is shaped like a finger. It is attached to the large intestine. Appendicitis means that this tube is swollen (inflamed). If this is not treated, the tube can tear (rupture). This can lead to a life-threatening infection. This condition can also cause pus to build up in the appendix (abscess). What are the causes? This condition may be caused by something that blocks the appendix. These include:  A ball of poop (stool).  Lymph glands that are bigger than normal. Sometimes the cause is not known. What increases the risk? You are more  likely to develop this condition if you are between 68 and 17 years of age. What are the signs or symptoms? Symptoms of this condition include:  Pain around the belly button (navel). ? The pain moves toward the lower right belly (abdomen). ? The pain can get worse with time. ? The pain can get worse if you cough. ? The pain can get worse if you move suddenly.  Tenderness in the lower right belly.  Feeling sick to your stomach (nauseous).  Throwing up (vomiting).  Not feeling hungry (loss of appetite).  A fever.  Having trouble pooping (constipation).  Watery poop (diarrhea).  Not feeling well. How is this treated? Most often, this condition is treated by taking out the appendix (appendectomy). There are two ways to do this:  Open surgery. For this method, the appendix is taken out through a large cut (incision). The cut is made in the lower right belly. This surgery may be used if: ? You have scars from another surgery. ? You have a bleeding condition. ? You are pregnant and will be having your baby soon. ? You have a condition that makes it hard to do the other type of surgery.  Laparoscopic surgery. For this method, the appendix is taken out through small cuts. Often, this surgery: ? Causes less pain. ? Causes fewer problems. ? Is easier to heal from. If your appendix tears and pus forms:  A drain may be put into the sore. The drain will be used to get rid of the pus.  You  may get an antibiotic medicine through an IV line.  Your appendix may or may not need to be taken out. Follow these instructions at home: If you had surgery, follow instructions from your doctor on how to care for yourself at home and how to take care of your cut from surgery. Medicines  Take over-the-counter and prescription medicines only as told by your doctor.  If you were prescribed an antibiotic medicine, take it as told by your doctor. Do not stop taking the antibiotic even if you start  to feel better. Eating and drinking Follow instructions from your doctor about what you cannot eat or drink. You may go back to your diet slowly if:  You no longer feel sick to your stomach.  You have stopped throwing up. General instructions  Do not use any products that contain nicotine or tobacco, such as cigarettes, e-cigarettes, and chewing tobacco. If you need help quitting, ask your doctor.  Do not drive or use heavy machinery while taking prescription pain medicine.  Ask your doctor if the medicine you are taking can cause trouble pooping. You may need to take steps to prevent or treat trouble pooping: ? Drink enough fluid to keep your pee (urine) pale yellow. ? Take over-the-counter or prescription medicines. ? Eat foods that are high in fiber. These include beans, whole grains, and fresh fruits and vegetables. ? Limit foods that are high in fat and sugar. These include fried or sweet foods.  Keep all follow-up visits as told by your doctor. This is important. Contact a doctor if:  There is pus, blood, or a lot of fluid coming from your cut or cuts from surgery.  You are sick to your stomach or you throw up. Get help right away if:  You have pain in your belly, and the pain is getting worse.  You have a fever.  You have chills.  You are very tired.  You have muscle pain.  You are short of breath. Summary  Appendicitis is swelling of the appendix. The appendix is a tube that is shaped like a finger. It is joined to the large intestine.  This condition may be caused by something that blocks the appendix. This can lead to an infection.  This condition is usually treated by taking out the appendix. This information is not intended to replace advice given to you by your health care provider. Make sure you discuss any questions you have with your health care provider. Document Released: 08/03/2011 Document Revised: 10/27/2017 Document Reviewed: 10/27/2017 Elsevier  Patient Education  2020 [employer].

## 2019-01-07 NOTE — Discharge Summary (Signed)
Physician Discharge Summary  Patient ID: Amanda Wilcox MRN: [id number] DOB/AGE: 1961-01-20 58 y.o.  Admit date: 01/03/2019 Discharge date: 01/09/2019  Admission Diagnoses: Acute perforated appendicitis   Discharge Diagnoses:  Active Problems:   Acute perforated appendicitis   Discharged Condition: fair  Hospital Course: Amanda Wilcox is a 58 yo who presented to the hospital with perforated appendicitis with multiple small microperforations of the appendix. She had a few days of pain before presenting. Due to this, we opted to do non operative management. She was brought into the hospital and was placed on IV antibiotics. She started on a diet, and progressed on her diet. Her pain was controlled with oral pain medication, and she was ambulating. She was having Bms before she left the hospital.  The patient has a pain contract, and I told her I would not be able prescribe more medication due to this, but she could contact her pain physician to discuss options for increasing her dose due to the perforation appendicitis.  She expressed understanding.  The patient had a drop in her leukocytosis prior to discharge, on the day of discharge the leukocytosis did go back to 11, but we discussed that she had no fevers or other worsening symptoms. We discussed the option of staying another day to recheck labs versus going home and returning with worsening pain, fevers, chills or inability to tolerate a diet as she may develop an abscess that needs drained.   She was sent home with Augment to complete a total of 14 days of antibiotics.   Consults: None  Significant Diagnostic Studies: CT a/p- microperforation of the appendix with stranding and inflammation, no abscess   Treatments: IV antibiotics and fluids   Discharge Exam: Blood pressure 127/76, pulse 74, temperature 98.3 F (36.8 C), temperature source Oral, resp. rate 18, height 5\' 4"  (1.626 m), weight 201 lb 8 oz (91.4 kg), SpO2 100 %. General  appearance: alert, cooperative and no distress Resp: normal work of breathing GI: soft, nondistended, mildly tender RLQ, no rebound or guarding Extremities: extremities normal, atraumatic, no cyanosis or edema  Disposition:  Home   Discharge Instructions    Call MD for:  difficulty breathing, headache or visual disturbances   Complete by: As directed    Call MD for:  persistant dizziness or light-headedness   Complete by: As directed    Call MD for:  persistant nausea and vomiting   Complete by: As directed    Call MD for:  redness, tenderness, or signs of infection (pain, swelling, redness, odor or green/yellow discharge around incision site)   Complete by: As directed    Call MD for:  severe uncontrolled pain   Complete by: As directed    Call MD for:  temperature >100.4   Complete by: As directed    Increase activity slowly   Complete by: As directed      Allergies as of 01/07/2019   No Known Allergies     Medication List    TAKE these medications   amoxicillin-clavulanate 875-125 MG tablet Commonly known as: Augmentin Take 1 tablet by mouth every 12 (twelve) hours for 11 days.   docusate sodium 100 MG capsule Commonly known as: COLACE Take 1 capsule (100 mg total) by mouth 2 (two) times daily.   DULoxetine 60 MG capsule Commonly known as: CYMBALTA Take 60 mg by mouth 2 (two) times a day.   gabapentin 300 MG capsule Commonly known as: NEURONTIN Take 300 mg by mouth 2 (two) times a  day.   HYDROcodone-acetaminophen 5-325 MG tablet Commonly known as: NORCO/VICODIN Take 1 tablet by mouth daily as needed for pain.   meloxicam 15 MG tablet Commonly known as: MOBIC Take 15 mg by mouth daily.   modafinil 200 MG tablet Commonly known as: PROVIGIL Take 200 mg by mouth daily.   ondansetron 4 MG disintegrating tablet Commonly known as: ZOFRAN-ODT Take 1 tablet (4 mg total) by mouth every 6 (six) hours as needed for nausea.   oxybutynin 5 MG 24 hr tablet Commonly  known as: DITROPAN-XL Take 5 mg by mouth daily.      Follow-up Information    Virl Cagey, MD Follow up on 02/14/2019.   Specialty: General Surgery Why: Call if you need to be seen sooner.  Contact information: [address] [place] [address] [phone]           Signed: Virl Cagey 01/09/2019, 12:32 PM

## 2019-01-09 ENCOUNTER — Encounter: Payer: Self-pay | Admitting: Family Medicine

## 2019-01-10 ENCOUNTER — Other Ambulatory Visit: Payer: Self-pay

## 2019-01-10 ENCOUNTER — Encounter: Payer: Self-pay | Admitting: Family Medicine

## 2019-01-10 ENCOUNTER — Telehealth: Payer: Self-pay

## 2019-01-10 ENCOUNTER — Ambulatory Visit (INDEPENDENT_AMBULATORY_CARE_PROVIDER_SITE_OTHER): Payer: BC Managed Care – PPO | Admitting: Family Medicine

## 2019-01-10 VITALS — Ht 64.0 in | Wt 204.0 lb

## 2019-01-10 DIAGNOSIS — Z9189 Other specified personal risk factors, not elsewhere classified: Secondary | ICD-10-CM

## 2019-01-10 DIAGNOSIS — IMO0001 Reserved for inherently not codable concepts without codable children: Secondary | ICD-10-CM

## 2019-01-10 DIAGNOSIS — R1084 Generalized abdominal pain: Secondary | ICD-10-CM

## 2019-01-10 DIAGNOSIS — K3532 Acute appendicitis with perforation and localized peritonitis, without abscess: Secondary | ICD-10-CM | POA: Diagnosis not present

## 2019-01-10 DIAGNOSIS — Z1211 Encounter for screening for malignant neoplasm of colon: Secondary | ICD-10-CM | POA: Diagnosis not present

## 2019-01-10 NOTE — Patient Instructions (Signed)
    Thank you for completing for visit via phone. I appreciate the opportunity to provide you with the care for your health and wellness. Today we discussed:  Appendix removal and Colonoscopy   Follow Up: After Sept 22,2020  No labs today  Referral for colonoscopy made today  Please continue all medications and take all the antibiotic until finished.  Remember to return to ED if you develop fever, chills, nausea and vomiting, or double over in pain. If pain worsens but no other signs you can call the dr who saw you in the hospital for follow up sooner.  Please continue to practice social distancing to keep you, your family, and our community safe.  If you must go out, please wear a Mask and practice good handwashing.  [place] YOUR HANDS WELL AND FREQUENTLY. AVOID TOUCHING YOUR FACE, UNLESS YOUR HANDS ARE FRESHLY WASHED.  GET FRESH AIR DAILY. STAY HYDRATED WITH WATER.   It was a pleasure to see you and I look forward to continuing to work together on your health and well-being. Please do not hesitate to call the office if you need care or have questions about your care.  Have a wonderful day and week.  With Gratitude,  Cherly Beach, DNP, AGNP-BC

## 2019-01-10 NOTE — Telephone Encounter (Signed)
Transition Care Management Follow-up Telephone Call   Date discharged? 01/07/2019               How have you been since you were released from the hospital? a lot of soreness but today is starting to feel better   Do you understand why you were in the hospital? Acute appendicitis   Do you understand the discharge instructions? yes   Where were you discharged to? home   Items Reviewed:  Medications reviewed: yes  Allergies reviewed: yes  Dietary changes reviewed: yes  Referrals reviewed: yes   Functional Questionnaire:   Activities of Daily Living (ADLs):  has help if needed    Any transportation issues/concerns?: no   Any patient concerns? no   Confirmed importance and date/time of follow-up visits scheduled 01/10/2019     Confirmed with patient if condition begins to worsen call PCP or go to the ER.  Patient was given the office number and encouraged to call back with question or concerns. Yes with verbal understanding

## 2019-01-10 NOTE — Progress Notes (Signed)
Virtual Visit via Telephone Note   This visit type was conducted due to national recommendations for restrictions regarding the COVID-19 Pandemic (e.g. social distancing) in an effort to limit this patient's exposure and mitigate transmission in our community.  Due to her co-morbid illnesses, this patient is at least at moderate risk for complications without adequate follow up.  This format is felt to be most appropriate for this patient at this time.  The patient did not have access to video technology/had technical difficulties with video requiring transitioning to audio format only (telephone).  All issues noted in this document were discussed and addressed.  No physical exam could be performed with this format.    Evaluation Performed:  Follow-up visit  Date:  01/10/2019   ID:  Amanda Wilcox, DOB August 04, 1960, MRN [id number]  Patient Location: Home Provider Location: Office  Location of Patient: Home Location of Provider: Telehealth Consent was obtain for visit to be over via telehealth. I verified that I am speaking with the correct person using two identifiers.  PCP:  Perlie Mayo, NP   Chief Complaint:    History of Present Illness:    Amanda Wilcox is a 58 y.o. female with history of anxiety, arthralgia, neuromuscular disorder-fibromyalgia among others.  Was seen on August 10 secondary to having some abdominal pain which she thought she had before secondary to constipation.  But she had never had the pain is bad.  Additionally having back pain as a consistency she had a hard time identifying the abdominal pain not being caused by uncontrolled back pain.  Had constipation, bloating, cramping and small infrequent stools.  Generalized abdominal pain with the assessment.  Drew CBC with elevation of WBC at 14 it was suggested for her to go to the emergency room as her pain did not become relieved with a magnesium citrate cleanout.  TOC: Hospital Admission Course: Presented to  the emergency room after having a few days of pain secondary to seeing her PCP with elevated WBC.  CT scan demonstrated perforated appendicitis with multiple small microperforations. MD opted to do non operative management. She was admitted to the hospital and was placed on IV antibiotics. She started on a diet, and progressed on her diet. Her pain was controlled with oral pain medication, and she was ambulating. She was having Bms before she left the hospital.  The patient had a drop in her leukocytosis prior to discharge, on the day of discharge the leukocytosis did go back to 11, but we discussed that she had no fevers or other worsening symptoms. She knows when to return to ED if worsening pain, fevers, chills or inability to tolerate a diet as she may develop an abscess that needs drained.   Today she is feeling much better but still having pain especially if she does not void or have BM frequently. It was suggested she have a colonoscopy prior to appendectomy if possible. So she needs referral for this now.   She was sent home with Augment to complete a total of 14 days of antibiotics.   The patient does not have symptoms concerning for COVID-19 infection (fever, chills, cough, or new shortness of breath).   Past Medical, Surgical, Social History, Allergies, and Medications have been Reviewed.   Past Medical History:  Diagnosis Date  . Anxiety    Trying to quit smoking - has cut back.  . Arthralgia   . Fatigue   . Neuromuscular disorder (HCC)    fibromyalgia  .  Restless leg syndrome    Past Surgical History:  Procedure Laterality Date  . CARPAL TUNNEL RELEASE Right      Current Meds  Medication Sig  . amoxicillin-clavulanate (AUGMENTIN) 875-125 MG tablet Take 1 tablet by mouth every 12 (twelve) hours for 11 days.  Marland Kitchen docusate sodium (COLACE) 100 MG capsule Take 1 capsule (100 mg total) by mouth 2 (two) times daily.  . DULoxetine (CYMBALTA) 60 MG capsule Take 60 mg by mouth 2  (two) times a day.  . gabapentin (NEURONTIN) 300 MG capsule Take 300 mg by mouth 2 (two) times a day.  Marland Kitchen HYDROcodone-acetaminophen (NORCO/VICODIN) 5-325 MG tablet Take 1 tablet by mouth daily as needed for pain.  . meloxicam (MOBIC) 15 MG tablet Take 15 mg by mouth daily.  . modafinil (PROVIGIL) 200 MG tablet Take 200 mg by mouth daily.  . ondansetron (ZOFRAN-ODT) 4 MG disintegrating tablet Take 1 tablet (4 mg total) by mouth every 6 (six) hours as needed for nausea.  Marland Kitchen oxybutynin (DITROPAN-XL) 5 MG 24 hr tablet Take 5 mg by mouth daily.     Allergies:   Patient has no known allergies.   Social History   Tobacco Use  . Smoking status: Former Smoker    Packs/day: 0.50    Years: 40.00    Pack years: 20.00    Types: Cigarettes  . Smokeless tobacco: Never Used  Substance Use Topics  . Alcohol use: No    Alcohol/week: 0.0 standard drinks  . Drug use: No     Family Hx: The patient's family history includes Epilepsy in her maternal aunt; Heart disease in her maternal grandfather; Multiple sclerosis in her maternal uncle; Stroke in her mother.  ROS:   Please see the history of present illness.    All other systems reviewed and are negative.   Labs/Other Tests and Data Reviewed:      Recent Labs: 01/03/2019: ALT 21 01/07/2019: BUN 13; Creatinine, Ser 0.87; Hemoglobin 12.4; Platelets 300; Potassium 4.1; Sodium 136   Recent Lipid Panel No results found for: CHOL, TRIG, HDL, CHOLHDL, LDLCALC, LDLDIRECT  Wt Readings from Last 3 Encounters:  01/10/19 204 lb (92.5 kg)  01/04/19 201 lb 8 oz (91.4 kg)  01/02/19 202 lb 0.6 oz (91.6 kg)     Objective:    Vital Signs:  Ht 5\' 4"  (1.626 m)   Wt 204 lb (92.5 kg)   BMI 35.02 kg/m    GEN:  alert and oriented  RESPIRATORY:  no shortness of breath noted in conversastion PSYCH:  normal mood and affect  ASSESSMENT & PLAN:     1. Transition of care performed with sharing of clinical summary As documented above  2. Acute perforated  appendicitis Medically treated for now. Needs colonoscopy prior to appendectomy. Advised to continue augment full course. Tolerating well.  3. Generalized abdominal pain Improving but still having trouble especially if she does not go to the bathroom  Frequently.  4. Encounter for screening for malignant neoplasm of colon Needs prior to possible appendectomy in near future. See AP notes for further detail if needed.   - Ambulatory referral to Gastroenterology  Time:   Today, I have spent 15 minutes with the patient with telehealth technology discussing the above problems.     Medication Adjustments/Labs and Tests Ordered: Current medicines are reviewed at length with the patient today.  Concerns regarding medicines are outlined above.   Tests Ordered: Orders Placed This Encounter  Procedures  . Ambulatory referral to Gastroenterology  Medication Changes: No orders of the defined types were placed in this encounter.   Disposition:  Follow up   5-6 weeks    Signed, Perlie Mayo, NP  01/10/2019 1:26 PM     [facility]

## 2019-01-11 ENCOUNTER — Encounter (INDEPENDENT_AMBULATORY_CARE_PROVIDER_SITE_OTHER): Payer: Self-pay | Admitting: *Deleted

## 2019-01-11 NOTE — Addendum Note (Signed)
Addended by: Perlie Mayo on: 01/11/2019 05:11 PM   Modules accepted: Level of Service

## 2019-01-13 ENCOUNTER — Encounter: Payer: Self-pay | Admitting: Family Medicine

## 2019-01-16 ENCOUNTER — Telehealth: Payer: Self-pay | Admitting: *Deleted

## 2019-01-16 NOTE — Telephone Encounter (Signed)
Long term disability forms received  Copied Noted sleeved

## 2019-01-18 ENCOUNTER — Ambulatory Visit: Payer: BC Managed Care – PPO | Admitting: Family Medicine

## 2019-01-18 NOTE — Addendum Note (Signed)
Addended by: Perlie Mayo on: 01/18/2019 12:14 PM   Modules accepted: Level of Service

## 2019-02-02 ENCOUNTER — Other Ambulatory Visit: Payer: Self-pay

## 2019-02-02 ENCOUNTER — Encounter (INDEPENDENT_AMBULATORY_CARE_PROVIDER_SITE_OTHER): Payer: Self-pay | Admitting: Nurse Practitioner

## 2019-02-02 ENCOUNTER — Ambulatory Visit (INDEPENDENT_AMBULATORY_CARE_PROVIDER_SITE_OTHER): Payer: BC Managed Care – PPO | Admitting: Nurse Practitioner

## 2019-02-02 ENCOUNTER — Telehealth: Payer: Self-pay

## 2019-02-02 ENCOUNTER — Encounter (INDEPENDENT_AMBULATORY_CARE_PROVIDER_SITE_OTHER): Payer: Self-pay | Admitting: *Deleted

## 2019-02-02 ENCOUNTER — Telehealth (INDEPENDENT_AMBULATORY_CARE_PROVIDER_SITE_OTHER): Payer: Self-pay | Admitting: *Deleted

## 2019-02-02 VITALS — BP 147/85 | HR 81 | Temp 98.4°F | Ht 64.0 in | Wt 193.4 lb

## 2019-02-02 DIAGNOSIS — Z1211 Encounter for screening for malignant neoplasm of colon: Secondary | ICD-10-CM | POA: Insufficient documentation

## 2019-02-02 DIAGNOSIS — K3532 Acute appendicitis with perforation and localized peritonitis, without abscess: Secondary | ICD-10-CM | POA: Diagnosis not present

## 2019-02-02 DIAGNOSIS — R1030 Lower abdominal pain, unspecified: Secondary | ICD-10-CM

## 2019-02-02 HISTORY — DX: Encounter for screening for malignant neoplasm of colon: Z12.11

## 2019-02-02 MED ORDER — SUPREP BOWEL PREP KIT 17.5-3.13-1.6 GM/177ML PO SOLN: 1.0000 | Freq: Once | ORAL | 0 refills | Status: AC

## 2019-02-02 NOTE — Telephone Encounter (Signed)
Called patient to let her know Cherly Beach NP was unable to complete paperwork because she isn't the one that took her out of work on disability and we didn't have any of the notes they were requesting. She stated she had just spoken with the person at [facility] and she said she had everything she needed for the case. I could just get rid of that paperwork. Paperwork placed in shred bin.

## 2019-02-02 NOTE — Patient Instructions (Signed)
1. Schedule a colonoscopy   2. Complete the provided lab order  3. Call our office if you have abdominal pain

## 2019-02-02 NOTE — Telephone Encounter (Signed)
Patient needs suprep TCS sch'd 10/2

## 2019-02-02 NOTE — Progress Notes (Signed)
Subjective:    Patient ID: Amanda Wilcox, female    DOB: 1960/10/27, 58 y.o.   MRN: [id number]  HPI Amanda Wilcox is a 57 year old female with a past medical history of anxiety, fibromyalgia, tobacco use and carpal tunnel syndrome.  She developed generalized abdominal pain for a few days which became severe.  She presented to [facility] emergency room 01/03/2019.  An abdominal/pelvic CAT scan identified a perforated appendicitis with multiple small microperforations.  She was evaluated by surgeon Dr. Constance Haw, surgery was deferred at that time and the patient received IV antibiotics.  Her hospital course was fairly uneventful.  She was discharged home 01/09/2019 on Augmentin 875 mg 1 p.o. twice daily for 14 days.  She was advised by Dr. Constance Haw to schedule a screening colonoscopy prior to proceeding with an appendectomy.  She presents today to schedule a colonoscopy.  He denies ever having a colonoscopy.  No family history of colon cancer.  She denies having any further abdominal pain.  She is passing a soft formed stool once daily.  She is taking Colace 100 mg twice daily.  She has seen bright red blood on the toilet tissue and sometimes on the stool in the past, no rectal bleeding for the past year.   Abdominal/pelvic CT:  1. Acute appendicitis with findings concerning for focally contained microperforation. No drainable fluid collection or abscess. 2. No bowel obstruction. 3. Mildly dilated main pancreatic duct. MRCP may provide better evaluation on a nonemergent basis. 4. Aortic Atherosclerosis   CBC    Component Value Date/Time   WBC 11.9 (H) 01/07/2019 0656   RBC 4.25 01/07/2019 0656   HGB 12.4 01/07/2019 0656   HCT 39.4 01/07/2019 0656   PLT 300 01/07/2019 0656   MCV 92.7 01/07/2019 0656   MCH 29.2 01/07/2019 0656   MCHC 31.5 01/07/2019 0656   RDW 13.1 01/07/2019 0656   LYMPHSABS 1.0 01/07/2019 0656   MONOABS 0.5 01/07/2019 0656   EOSABS 0.2 01/07/2019 0656   BASOSABS  0.0 01/07/2019 0656   Past Medical History:  Diagnosis Date  . Anxiety    Trying to quit smoking - has cut back.  . Arthralgia   . Fatigue   . Neuromuscular disorder (HCC)    fibromyalgia  . Restless leg syndrome    Past Surgical History:  Procedure Laterality Date  . CARPAL TUNNEL RELEASE Right    Current Outpatient Medications on File Prior to Visit  Medication Sig Dispense Refill  . docusate sodium (COLACE) 100 MG capsule Take 1 capsule (100 mg total) by mouth 2 (two) times daily. 60 capsule 2  . DULoxetine (CYMBALTA) 60 MG capsule Take 60 mg by mouth 2 (two) times a day.    . gabapentin (NEURONTIN) 300 MG capsule Take 300 mg by mouth 2 (two) times a day.    Marland Kitchen HYDROcodone-acetaminophen (NORCO/VICODIN) 5-325 MG tablet Take 1 tablet by mouth daily as needed for pain.    . meloxicam (MOBIC) 15 MG tablet Take 15 mg by mouth daily.    . modafinil (PROVIGIL) 200 MG tablet Take 200 mg by mouth daily.    . ondansetron (ZOFRAN-ODT) 4 MG disintegrating tablet Take 1 tablet (4 mg total) by mouth every 6 (six) hours as needed for nausea. 20 tablet 0  . oxybutynin (DITROPAN-XL) 5 MG 24 hr tablet Take 5 mg by mouth daily.    . varenicline (CHANTIX) 1 MG tablet Take 1 mg by mouth as needed for smoking cessation.    . cholecalciferol (  VITAMIN D3) 25 MCG (1000 UT) tablet Take 1,000 Units by mouth daily.     No current facility-administered medications on file prior to visit.   No Known Allergies Social History   Socioeconomic History  . Marital status: Divorced    Spouse name: Not on file  . Number of children: 3  . Years of education: GED  . Highest education level: Not on file  Occupational History  . Occupation: Disability  Social Needs  . Financial resource strain: Not hard at all  . Food insecurity    Worry: Never true    Inability: Never true  . Transportation needs    Medical: No    Non-medical: No  Tobacco Use  . Smoking status: Former Smoker    Packs/day: 0.50    Years:  40.00    Pack years: 20.00    Types: Cigarettes  . Smokeless tobacco: Never Used  Substance and Sexual Activity  . Alcohol use: No    Alcohol/week: 0.0 standard drinks  . Drug use: No  . Sexual activity: Not Currently  Lifestyle  . Physical activity    Days per week: 0 days    Minutes per session: 0 min  . Stress: Only a little  Relationships  . Social Herbalist on phone: Three times a week    Gets together: Twice a week    Attends religious service: Never    Active member of club or organization: No    Attends meetings of clubs or organizations: Never    Relationship status: Divorced  . Intimate partner violence    Fear of current or ex partner: No    Emotionally abused: No    Physically abused: No    Forced sexual activity: No  Other Topics Concern  . Not on file  Social History Narrative   Lives with a Nino Glow  (roommate)   1 dog: Tank[employer] Water engineer mix)    Left-handed.       Daughter Zebedee Iba- 3 grandchildren    Daughter Lattie Haw (in the army)    Son Darnell Level -2 grandchildren       Enjoys spending time with best friends, game nights, cook outs, spending time with kids and grandchildren      Diet: eggs, pancakes, oatmeal, sausage, chicken, does not get a lot of veggies and fruits like should. Does eat a lot more breads, meats.    Caffeine: 2 c coffee (cream and sugar), will drink some soft drinks/but rare   Water: 4 cups daily      Wears seat belt    Wears sunscreen   Smoke and carbon monoxide detectors   Does not use phone while driving   Family History  Problem Relation Age of Onset  . Stroke Mother   . Epilepsy Maternal Aunt   . Multiple sclerosis Maternal Uncle   . Heart disease Maternal Grandfather    Review of Systems see HPI, all other systems reviewed are negative    Objective:   Physical Exam BP (!) 147/85   Pulse 81   Temp 98.4 F (36.9 C) (Oral)   Ht 5\' 4"  (1.626 m)   Wt 193 lb 6.4 oz (87.7 kg)   BMI 33.20 kg/m   General:  58 year old female appears slightly anxious in no acute distress Eyes: Sclera nonicteric, conjunctiva pink Mouth: Poor dentition Neck: Supple, no lymphadenopathy or thyromegaly Heart: Regular rate and rhythm, no murmur Lungs: Breath sounds clear throughout Abdomen: Soft, nondistended, nontender with positive  bowel sounds to all 4 quadrants, no HSM. Extremities: No edema Neuro: Alert and oriented x4, no focal deficits    Assessment & Plan:   34.  58 year old female who developed generalized abdominal pain found to have a perforated appendix 01/03/2019, surgery was deferred and she received IV antibiotics then po Augmentin x 14 days. She requires a screening colonoscopy prior to proceeding with an appendectomy  -CBC, CRP -Schedule a colonoscopy, colonoscopy benefits and risks discussed including risk of sedation, risk of bleeding, perforation and infection -Patient to call our office if her abdominal pain recurs  2.  Anxiety -Colonoscopy with propofol ordered

## 2019-02-03 LAB — CBC WITH DIFFERENTIAL/PLATELET
Absolute Monocytes: 486 cells/uL (ref 200–950)
Basophils Absolute: 27 cells/uL (ref 0–200)
Basophils Relative: 0.3 %
Eosinophils Absolute: 225 cells/uL (ref 15–500)
Eosinophils Relative: 2.5 %
HCT: 39.9 % (ref 35.0–45.0)
Hemoglobin: 13.1 g/dL (ref 11.7–15.5)
Lymphs Abs: 2178 cells/uL (ref 850–3900)
MCH: 28.4 pg (ref 27.0–33.0)
MCHC: 32.8 g/dL (ref 32.0–36.0)
MCV: 86.6 fL (ref 80.0–100.0)
MPV: 11.2 fL (ref 7.5–12.5)
Monocytes Relative: 5.4 %
Neutro Abs: 6084 cells/uL (ref 1500–7800)
Neutrophils Relative %: 67.6 %
Platelets: 398 10*3/uL (ref 140–400)
RBC: 4.61 10*6/uL (ref 3.80–5.10)
RDW: 12.6 % (ref 11.0–15.0)
Total Lymphocyte: 24.2 %
WBC: 9 10*3/uL (ref 3.8–10.8)

## 2019-02-03 LAB — C-REACTIVE PROTEIN: CRP: 6.7 mg/L (ref <8.0)

## 2019-02-10 ENCOUNTER — Telehealth (INDEPENDENT_AMBULATORY_CARE_PROVIDER_SITE_OTHER): Payer: Self-pay | Admitting: Nurse Practitioner

## 2019-02-10 NOTE — Telephone Encounter (Signed)
Ann pls put in MRCP recall for Nov. 2020 (dilated pancreatic duct seen on CT) thx

## 2019-02-13 NOTE — Telephone Encounter (Signed)
Noted in recall 

## 2019-02-14 ENCOUNTER — Ambulatory Visit (INDEPENDENT_AMBULATORY_CARE_PROVIDER_SITE_OTHER): Payer: BC Managed Care – PPO | Admitting: General Surgery

## 2019-02-14 ENCOUNTER — Encounter: Payer: Self-pay | Admitting: General Surgery

## 2019-02-14 ENCOUNTER — Other Ambulatory Visit: Payer: Self-pay

## 2019-02-14 VITALS — BP 125/75 | HR 83 | Temp 96.8°F | Resp 16 | Ht 64.0 in | Wt 196.0 lb

## 2019-02-14 DIAGNOSIS — K3532 Acute appendicitis with perforation and localized peritonitis, without abscess: Secondary | ICD-10-CM

## 2019-02-14 NOTE — Patient Instructions (Signed)
Laparoscopic Appendectomy, Adult ° °A laparoscopic appendectomy is a surgery to take out the appendix. The appendix is a finger-like structure that is attached to the large intestine. In this surgery, the appendix is removed through three small incisions with the help of a thin, lighted tube that has a camera (laparoscope). °This procedure may be done to prevent an inflamed appendix from bursting (rupturing). It may also be done to treat the infection from an appendix that has already ruptured. It is usually done right after inflammation of the appendix (appendicitis) is diagnosed. This is a minimally invasive surgery. It usually results in less pain, fewer problems, and a quicker recovery than surgery done through a large incision. °Tell a health care provider about: °· Any allergies you have. °· All medicines you are taking, including vitamins, herbs, eye drops, creams, and over-the-counter medicines. °· Use of steroids (by mouth or creams). °· Any problems you or family members have had with anesthetic medicines. °· Any blood disorders you have. °· Any surgeries you have had. °· Any medical conditions you have. °· Whether you are pregnant or may be pregnant. °What are the risks? °Generally, this is a safe procedure. However, problems may occur, including: °· Infection. °· Bleeding. °· Allergic reactions to medicines. °· Damage to other structures or organs. °· The formation of pus (abscesses). °· Long-lasting pain or scarring at the incision sites or inside the abdomen. °· Blood clots in the legs. °What happens before the procedure? °Eating and drinking restrictions °Follow instructions from your health care provider about eating and drinking restrictions. You may be asked not to eat or drink as soon as the diagnosis of appendicitis is made. °Medicines °Ask your health care provider about: °· Changing or stopping your regular medicines. This is especially important if you are taking diabetes medicines or blood  thinners. °· Taking medicines such as aspirin and ibuprofen. These medicines can thin your blood. Do not take these medicines unless your health care provider tells you to take them. °· Taking over-the-counter medicines, vitamins, herbs, and supplements. °General instructions °· Plan to have someone take you home from the hospital. °· If you will be going home right after the procedure, plan to have someone with you for 24 hours. °· You may be given antibiotic medicine to help prevent infection or to treat existing inflammation or infection. °· Ask your health care provider how your surgical site will be marked or identified. °· Ask your health care provider what steps will be taken to help prevent infection. These may include: °? Removing hair at the surgery site. °? Washing skin with a germ-killing soap. °? Taking antibiotic medicine. °What happens during the procedure? ° °· An IV will be inserted into one of your veins. °· You will be given one or more of the following: °? A medicine to help you relax (sedative). °? A medicine to numb the area (local anesthetic). °? A medicine to make you fall asleep (general anesthetic). °· A thin, flexible tube (catheter) may be put into your bladder to drain urine. °· A tube may be passed through your nose and into your stomach (NG tube, or nasogastric tube) to drain any stomach contents. °· Your surgeon will make three small incisions near your belly button (navel). °· Air-like gas will be used to fill your abdomen. The gas will make your abdomen expand. This helps the surgeon see clearly and gives him or her more room to work. °· A laparoscope will be passed through one   of the incisions. °· Other long, thin surgical instruments will be passed through the other incisions. °· The appendix will be located and removed through one of the incisions. °· The abdomen may be washed out to remove bacteria. °· The incisions will be closed with stitches (sutures), staples, or adhesive  strips. °· A bandage (dressing) may be used to cover the incisions. °· If a tube was inserted into your bladder or stomach, it will be removed. °The procedure may vary among health care providers and hospitals. °What happens after the procedure? °· Your blood pressure, heart rate, breathing rate, and blood oxygen level will be monitored until you leave the hospital. °· You will be given medicines as needed to control pain. °· Do not drive for 24 hours if you were given a sedative during your procedure. °· If your appendix did not rupture, you may be able to go home the same day after your surgery. °· If your appendix ruptured: °? You will get antibiotic medicine through an IV line. °? You may be sent home with a temporary drain. °Summary °· A laparoscopic appendectomy is a surgery to take out the appendix. The appendix is removed through three small incisions with the help of a thin, lighted tube that has a camera. °· This is a safe procedure, but there are some risks, including bleeding, infection, allergic reaction to medicines, or damage to other organs. °· You may be asked not to eat or drink as soon as a diagnosis of appendicitis is made. °· After the procedure, your blood pressure, heart rate, breathing rate, and blood oxygen level will be monitored until you leave the hospital. °This information is not intended to replace advice given to you by your health care provider. Make sure you discuss any questions you have with your health care provider. °Document Released: 12/24/2003 Document Revised: 11/11/2017 Document Reviewed: 11/11/2017 °Elsevier Patient Education © 2020 [employer] Inc[employer] ° °

## 2019-02-14 NOTE — Progress Notes (Signed)
[facility] History and Physical   Chief Complaint    Abdominal Pain      Amanda Wilcox is a 58 y.o. female.  HPI: Amanda Wilcox is a very nice 58 yo who has a history of perforated appendicitis treated with antibiotics. She has completed her course of antibiotics, and says she is doing well. She had presented to the hospital 01/04/19 with symptoms, and was discharged on 01/07/2019. She never had a collection that formed that required drainage. The patient says she is eating and drinking and having regular Bms.  She is scheduled to have her first colonoscopy with Dr. Laural Golden 02/24/2019. She denies any abdominal pain or issues with nausea/vomiting. She is worried about having another episode of appendicitis.  Past Medical History:  Diagnosis Date  . Anxiety    Trying to quit smoking - has cut back.  . Arthralgia   . Fatigue   . Neuromuscular disorder (HCC)    fibromyalgia  . Restless leg syndrome     Past Surgical History:  Procedure Laterality Date  . CARPAL TUNNEL RELEASE Right     Family History  Problem Relation Age of Onset  . Stroke Mother   . Epilepsy Maternal Aunt   . Multiple sclerosis Maternal Uncle   . Heart disease Maternal Grandfather     Social History   Tobacco Use  . Smoking status: Former Smoker    Packs/day: 0.50    Years: 40.00    Pack years: 20.00    Types: Cigarettes  . Smokeless tobacco: Never Used  Substance Use Topics  . Alcohol use: No    Alcohol/week: 0.0 standard drinks  . Drug use: No    Medications: I have reviewed the patient's current medications. Allergies as of 02/14/2019   No Known Allergies     Medication List       Accurate as of February 14, 2019 11:08 AM. If you have any questions, ask your nurse or doctor.        cholecalciferol 25 MCG (1000 UT) tablet Commonly known as: VITAMIN D3 Take 1,000 Units by mouth daily.   docusate sodium 100 MG capsule Commonly known as: COLACE Take 1 capsule (100 mg  total) by mouth 2 (two) times daily.   DULoxetine 60 MG capsule Commonly known as: CYMBALTA Take 60 mg by mouth 2 (two) times a day.   gabapentin 300 MG capsule Commonly known as: NEURONTIN Take 300 mg by mouth 2 (two) times a day.   HYDROcodone-acetaminophen 5-325 MG tablet Commonly known as: NORCO/VICODIN Take 1 tablet by mouth daily as needed for pain.   meloxicam 15 MG tablet Commonly known as: MOBIC Take 15 mg by mouth daily.   modafinil 200 MG tablet Commonly known as: PROVIGIL Take 200 mg by mouth daily.   ondansetron 4 MG disintegrating tablet Commonly known as: ZOFRAN-ODT Take 1 tablet (4 mg total) by mouth every 6 (six) hours as needed for nausea.   oxybutynin 5 MG 24 hr tablet Commonly known as: DITROPAN-XL Take 5 mg by mouth daily.   varenicline 1 MG tablet Commonly known as: CHANTIX Take 1 mg by mouth as needed for smoking cessation.        ROS:  A comprehensive review of systems was negative except for: Constitutional: positive for anxious about recurrent disease  Blood pressure 125/75, pulse 83, temperature (!) 96.8 F (36 C), temperature source Tympanic, resp. rate 16, height 5\' 4"  (1.626 m), weight 196 lb (88.9 kg), SpO2 97 %. Physical Exam  Vitals signs reviewed.  Constitutional:      Appearance: She is well-developed.  HENT:     Head: Normocephalic and atraumatic.  Cardiovascular:     Rate and Rhythm: Normal rate and regular rhythm.  Pulmonary:     Effort: Pulmonary effort is normal.     Breath sounds: Normal breath sounds.  Abdominal:     General: Abdomen is flat. There is no distension.     Tenderness: There is no abdominal tenderness. There is no guarding or rebound.     Hernia: No hernia is present.  Musculoskeletal: Normal range of motion.  Skin:    General: Skin is warm and dry.  Neurological:     General: No focal deficit present.     Mental Status: She is alert and oriented to person, place, and time.     Results: CT a/p  8/11 reviewed significantly inflamed and enlarged appendix with microperforation of air around it at the tip, going centrally down into the pelvis, thickened base and appendicolith  Assessment & Plan:  Amanda Wilcox is a 58 y.o. female with perforated appendicitis 01/03/19 that is now resolved and she is feeling better. She has never had a colonoscopy and is getting that 02/24/2019.  She is otherwise well but anxious about having another episode.  She is on disability now per her report.    -OR for interval laparoscopic appendectomy after colonoscopy, we discussed that some people are not doing interval appendectomies but given the risk of possible cancer and also her anxiety about a recurrence, I think that it is appropriate. We discussed the risk of bleeding, infection, injury to another organ, and need for an open surgery. She wants to proceed.   All questions were answered to the satisfaction of the patient.   Amanda Wilcox 02/14/2019, 11:08 AM

## 2019-02-15 ENCOUNTER — Ambulatory Visit (INDEPENDENT_AMBULATORY_CARE_PROVIDER_SITE_OTHER): Payer: BC Managed Care – PPO | Admitting: Family Medicine

## 2019-02-15 ENCOUNTER — Encounter: Payer: Self-pay | Admitting: Family Medicine

## 2019-02-15 VITALS — BP 136/76 | HR 86 | Temp 98.9°F | Resp 14 | Ht 64.0 in | Wt 196.0 lb

## 2019-02-15 DIAGNOSIS — Z1239 Encounter for other screening for malignant neoplasm of breast: Secondary | ICD-10-CM | POA: Diagnosis not present

## 2019-02-15 DIAGNOSIS — Z720 Tobacco use: Secondary | ICD-10-CM | POA: Diagnosis not present

## 2019-02-15 DIAGNOSIS — J3089 Other allergic rhinitis: Secondary | ICD-10-CM

## 2019-02-15 DIAGNOSIS — R1084 Generalized abdominal pain: Secondary | ICD-10-CM

## 2019-02-15 DIAGNOSIS — Z23 Encounter for immunization: Secondary | ICD-10-CM

## 2019-02-15 NOTE — Patient Instructions (Signed)
Thank you for coming into the office today. I appreciate the opportunity to provide you with the care for your health and wellness. Today we discussed: up coming procedures  Follow up: Dec 2020  Mammogram order placed today-will have 1 year to complete  Tylenol if arm gets sore.  Continue all medications as ordered.  Message with Nasal spray name  Please continue to practice social distancing to keep you, your family, and our community safe.  If you must go out, please wear a Mask and practice good handwashing.  [place] YOUR HANDS WELL AND FREQUENTLY. AVOID TOUCHING YOUR FACE, UNLESS YOUR HANDS ARE FRESHLY WASHED.  GET FRESH AIR DAILY. STAY HYDRATED WITH WATER.   It was a pleasure to see you and I look forward to continuing to work together on your health and well-being. Please do not hesitate to call the office if you need care or have questions about your care.  Have a wonderful day and week. With Gratitude, Cherly Beach, DNP, AGNP-BC    Coping with Quitting Smoking  Quitting smoking is a physical and mental challenge. You will face cravings, withdrawal symptoms, and temptation. Before quitting, work with your health care provider to make a plan that can help you cope. Preparation can help you quit and keep you from giving in. How can I cope with cravings? Cravings usually last for 5-10 minutes. If you get through it, the craving will pass. Consider taking the following actions to help you cope with cravings:  Keep your mouth busy: ? Chew sugar-free gum. ? Suck on hard candies or a straw. ? Brush your teeth.  Keep your hands and body busy: ? Immediately change to a different activity when you feel a craving. ? Squeeze or play with a ball. ? Do an activity or a hobby, like making bead jewelry, practicing needlepoint, or working with wood. ? Mix up your normal routine. ? Take a short exercise break. Go for a quick walk or run up and down stairs. ? Spend time in  public places where smoking is not allowed.  Focus on doing something kind or helpful for someone else.  Call a friend or family member to talk during a craving.  Join a support group.  Call a quit line, such as 1-800-QUIT-NOW.  Talk with your health care provider about medicines that might help you cope with cravings and make quitting easier for you. How can I deal with withdrawal symptoms? Your body may experience negative effects as it tries to get used to not having nicotine in the system. These effects are called withdrawal symptoms. They may include:  Feeling hungrier than normal.  Trouble concentrating.  Irritability.  Trouble sleeping.  Feeling depressed.  Restlessness and agitation.  Craving a cigarette. To manage withdrawal symptoms:  Avoid places, people, and activities that trigger your cravings.  Remember why you want to quit.  Get plenty of sleep.  Avoid coffee and other caffeinated drinks. These may worsen some of your symptoms. How can I handle social situations? Social situations can be difficult when you are quitting smoking, especially in the first few weeks. To manage this, you can:  Avoid parties, bars, and other social situations where people might be smoking.  Avoid alcohol.  Leave right away if you have the urge to smoke.  Explain to your family and friends that you are quitting smoking. Ask for understanding and support.  Plan activities with friends or family where smoking is not an option. What are  some ways I can cope with stress? Wanting to smoke may cause stress, and stress can make you want to smoke. Find ways to manage your stress. Relaxation techniques can help. For example:  Breathe slowly and deeply, in through your nose and out through your mouth.  Listen to soothing, relaxing music.  Talk with a family member or friend about your stress.  Light a candle.  Soak in a bath or take a shower.  Think about a peaceful place.  What are some ways I can prevent weight gain? Be aware that many people gain weight after they quit smoking. However, not everyone does. To keep from gaining weight, have a plan in place before you quit and stick to the plan after you quit. Your plan should include:  Having healthy snacks. When you have a craving, it may help to: ? Eat plain popcorn, crunchy carrots, celery, or other cut vegetables. ? Chew sugar-free gum.  Changing how you eat: ? Eat small portion sizes at meals. ? Eat 4-6 small meals throughout the day instead of 1-2 large meals a day. ? Be mindful when you eat. Do not watch television or do other things that might distract you as you eat.  Exercising regularly: ? Make time to exercise each day. If you do not have time for a long workout, do short bouts of exercise for 5-10 minutes several times a day. ? Do some form of strengthening exercise, like weight lifting, and some form of aerobic exercise, like running or swimming.  Drinking plenty of water or other low-calorie or no-calorie drinks. Drink 6-8 glasses of water daily, or as much as instructed by your health care provider. Summary  Quitting smoking is a physical and mental challenge. You will face cravings, withdrawal symptoms, and temptation to smoke again. Preparation can help you as you go through these challenges.  You can cope with cravings by keeping your mouth busy (such as by chewing gum), keeping your body and hands busy, and making calls to family, friends, or a helpline for people who want to quit smoking.  You can cope with withdrawal symptoms by avoiding places where people smoke, avoiding drinks with caffeine, and getting plenty of rest.  Ask your health care provider about the different ways to prevent weight gain, avoid stress, and handle social situations. This information is not intended to replace advice given to you by your health care provider. Make sure you discuss any questions you have with  your health care provider. Document Released: 05/08/2016 Document Revised: 04/23/2017 Document Reviewed: 05/08/2016 Elsevier Patient Education  2020 [employer].

## 2019-02-15 NOTE — Progress Notes (Signed)
Subjective:     Patient ID: Amanda Wilcox, female   DOB: 05-Mar-1961, 58 y.o.   MRN: [id number]  Amanda Wilcox presents for Follow-up  She has completed treatment with antibiotics of her perforated appendix.  She reports she is doing well.  She has no more abdominal pain.  She is worried about it coming back.  I have sent her to the hospital on August 12 with the symptoms that she presented with after working out that it was not constipation.  She was discharged on August 15  She never had a collection that formed that required drainage.  She reports eating and drinking.  She reports that she has normal BMs without issue.  She is scheduled to have her first colonoscopy with Dr. Laural Golden 02/24/2019. She denies any abdominal pain or issues with nausea/vomiting. She is worried about having another episode of appendicitis.  She will have her appendectomy on October 14 pending any findings of the colonoscopy.  Today patient denies signs and symptoms of COVID 19 infection including fever, chills, cough, shortness of breath, and headache.  Past Medical, Surgical, Social History, Allergies, and Medications have been Reviewed.  Past Medical History:  Diagnosis Date  . Anxiety    Trying to quit smoking - has cut back.  . Arthralgia   . Fatigue   . Neuromuscular disorder (HCC)    fibromyalgia  . Restless leg syndrome    Past Surgical History:  Procedure Laterality Date  . CARPAL TUNNEL RELEASE Right    Social History   Socioeconomic History  . Marital status: Divorced    Spouse name: Not on file  . Number of children: 3  . Years of education: GED  . Highest education Wilcox: Not on file  Occupational History  . Occupation: Disability  Social Needs  . Financial resource strain: Not hard at all  . Food insecurity    Worry: Never true    Inability: Never true  . Transportation needs    Medical: No    Non-medical: No  Tobacco Use  . Smoking status: Former Smoker    Packs/day: 0.50     Years: 40.00    Pack years: 20.00    Types: Cigarettes  . Smokeless tobacco: Never Used  Substance and Sexual Activity  . Alcohol use: No    Alcohol/week: 0.0 standard drinks  . Drug use: No  . Sexual activity: Not Currently  Lifestyle  . Physical activity    Days per week: 0 days    Minutes per session: 0 min  . Stress: Only a little  Relationships  . Social Herbalist on phone: Three times a week    Gets together: Twice a week    Attends religious service: Never    Active member of club or organization: No    Attends meetings of clubs or organizations: Never    Relationship status: Divorced  . Intimate partner violence    Fear of current or ex partner: No    Emotionally abused: No    Physically abused: No    Forced sexual activity: No  Other Topics Concern  . Not on file  Social History Narrative   Lives with a Amanda Wilcox  (roommate)   1 dog: Amanda Wilcox engineer mix)    Left-handed.       Daughter Amanda Iba- 3 grandchildren    Daughter Amanda Wilcox (in the army)    Son Amanda Wilcox -2 grandchildren       Enjoys spending  time with best friends, game nights, cook outs, spending time with kids and grandchildren      Diet: eggs, pancakes, oatmeal, sausage, chicken, does not get a lot of veggies and fruits like should. Does eat a lot more breads, meats.    Caffeine: 2 c coffee (cream and sugar), will drink some soft drinks/but rare   Wilcox: 4 cups daily      Wears seat belt    Wears sunscreen   Smoke and carbon monoxide detectors   Does not use phone while driving    Outpatient Encounter Medications as of 02/15/2019  Medication Sig  . cholecalciferol (VITAMIN D3) 25 MCG (1000 UT) tablet Take 1,000 Units by mouth daily.  Marland Kitchen docusate sodium (COLACE) 100 MG capsule Take 1 capsule (100 mg total) by mouth 2 (two) times daily.  . DULoxetine (CYMBALTA) 60 MG capsule Take 60 mg by mouth 2 (two) times a day.  . gabapentin (NEURONTIN) 300 MG capsule Take 300 mg by mouth 2 (two)  times a day.  Marland Kitchen HYDROcodone-acetaminophen (NORCO/VICODIN) 5-325 MG tablet Take 1 tablet by mouth daily as needed for pain.  . meloxicam (MOBIC) 15 MG tablet Take 15 mg by mouth daily.  . modafinil (PROVIGIL) 200 MG tablet Take 200 mg by mouth daily.  . ondansetron (ZOFRAN-ODT) 4 MG disintegrating tablet Take 1 tablet (4 mg total) by mouth every 6 (six) hours as needed for nausea.  Marland Kitchen oxybutynin (DITROPAN-XL) 5 MG 24 hr tablet Take 5 mg by mouth daily.  . varenicline (CHANTIX) 1 MG tablet Take 1 mg by mouth as needed for smoking cessation.   No facility-administered encounter medications on file as of 02/15/2019.    No Known Allergies  Review of Systems  Constitutional: Negative for chills and fever.  HENT: Negative.   Eyes: Negative.   Respiratory: Negative.   Cardiovascular: Negative.   Gastrointestinal: Negative.   Endocrine: Negative.   Genitourinary: Negative.   Musculoskeletal: Negative.   Skin: Negative.   Allergic/Immunologic: Negative.   Neurological: Negative.   Hematological: Negative.   Psychiatric/Behavioral: Negative.   All other systems reviewed and are negative.      Objective:     BP 136/76   Pulse 86   Temp 98.9 F (37.2 C) (Oral)   Resp 14   Ht 5\' 4"  (1.626 m)   Wt 196 lb 0.6 oz (88.9 kg)   SpO2 95%   BMI 33.65 kg/m   Physical Exam Vitals signs and nursing note reviewed.  Constitutional:      Appearance: Normal appearance. She is well-developed and well-groomed. She is obese.  HENT:     Head: Normocephalic and atraumatic.     Right Ear: External ear normal.     Left Ear: External ear normal.     Nose: Nose normal.     Mouth/Throat:     Mouth: Mucous membranes are moist.     Pharynx: Oropharynx is clear.  Eyes:     General:        Right eye: No discharge.        Left eye: No discharge.     Conjunctiva/sclera: Conjunctivae normal.  Neck:     Musculoskeletal: Normal range of motion and neck supple.  Cardiovascular:     Rate and Rhythm:  Normal rate and regular rhythm.     Pulses: Normal pulses.     Heart sounds: Normal heart sounds.  Pulmonary:     Effort: Pulmonary effort is normal.     Breath sounds: Normal  breath sounds.  Musculoskeletal: Normal range of motion.  Skin:    General: Skin is warm.  Neurological:     General: No focal deficit present.     Mental Status: She is alert and oriented to person, place, and time.  Psychiatric:        Attention and Perception: Attention normal.        Mood and Affect: Mood normal.        Speech: Speech normal.        Behavior: Behavior normal. Behavior is cooperative.        Thought Content: Thought content normal.        Cognition and Memory: Cognition normal.        Judgment: Judgment normal.        Assessment and Plan       1. Nicotine abuse Asked about quitting: confirms they are currently smokes cigarettes Advise to quit smoking: Educated about QUITTING to reduce the risk of cancer, cardio and cerebrovascular disease. Assess willingness: working on cutting back. Assist with counseling and pharmacotherapy: Counseled for 5 minutes and literature provided. Arrange for follow up: working on quitting follow up in 3 months and continue to offer help.   2. Environmental and seasonal allergies Needs refill of allergy medication will call the office back with the name of the medication she does not remember at this time.  3. Generalized abdominal pain Resolved, has upcoming colonoscopy on October 2.  Post findings of that she will have appendectomy on October 14 pending.  4. Encounter for screening for malignant neoplasm of breast Needs to have mammogram - MM 3D SCREEN BREAST BILATERAL; Future  5. Need for immunization against influenza Patient was educated on the recommendation for flu vaccine. After obtaining informed consent, the vaccine was administered no adverse effects noted at time of administration. Patient provided with education on arm soreness and use of  tylenol or ibuprofen (if safe) for this. Encourage to use the arm vaccine was given in to help reduce the soreness. Patient educated on the signs of a reaction to the vaccine and advised to contact the office should these occur.    - Flu Vaccine QUAD 36+ mos IM         Follow-up: December  Perlie Mayo, DNP, AGNP-BC [place], [place], [place] [address] Office Hours: Mon-Thurs 8 am-5 pm; Fri 8 am-12 pm Office Phone:  [phone]  Office Fax: [phone]

## 2019-02-20 ENCOUNTER — Encounter: Payer: Self-pay | Admitting: Family Medicine

## 2019-02-21 ENCOUNTER — Encounter (HOSPITAL_COMMUNITY): Payer: Self-pay

## 2019-02-21 ENCOUNTER — Other Ambulatory Visit: Payer: Self-pay

## 2019-02-22 ENCOUNTER — Encounter (HOSPITAL_COMMUNITY)
Admission: RE | Admit: 2019-02-22 | Discharge: 2019-02-22 | Disposition: A | Discharge status: Home / Self Care | Payer: BC Managed Care – PPO | Source: Ambulatory Visit | Attending: Internal Medicine | Admitting: Internal Medicine

## 2019-02-22 ENCOUNTER — Other Ambulatory Visit (HOSPITAL_COMMUNITY)
Admission: RE | Admit: 2019-02-22 | Discharge: 2019-02-22 | Disposition: A | Discharge status: Home / Self Care | Payer: BC Managed Care – PPO | Source: Ambulatory Visit | Attending: Internal Medicine | Admitting: Internal Medicine

## 2019-02-22 DIAGNOSIS — Z20828 Contact with and (suspected) exposure to other viral communicable diseases: Secondary | ICD-10-CM | POA: Diagnosis present

## 2019-02-23 DIAGNOSIS — Z1211 Encounter for screening for malignant neoplasm of colon: Secondary | ICD-10-CM | POA: Diagnosis not present

## 2019-02-23 DIAGNOSIS — D125 Benign neoplasm of sigmoid colon: Secondary | ICD-10-CM

## 2019-02-23 DIAGNOSIS — D123 Benign neoplasm of transverse colon: Secondary | ICD-10-CM

## 2019-02-23 LAB — SARS CORONAVIRUS 2 (TAT 6-24 HRS): SARS Coronavirus 2: NEGATIVE

## 2019-02-24 ENCOUNTER — Ambulatory Visit (HOSPITAL_COMMUNITY)
Admission: RE | Admit: 2019-02-24 | Discharge: 2019-02-24 | Disposition: A | Discharge status: Home / Self Care | Payer: BC Managed Care – PPO | Attending: Internal Medicine | Admitting: Internal Medicine

## 2019-02-24 ENCOUNTER — Encounter (HOSPITAL_COMMUNITY): Payer: Self-pay | Admitting: *Deleted

## 2019-02-24 ENCOUNTER — Ambulatory Visit (HOSPITAL_COMMUNITY): Payer: BC Managed Care – PPO | Admitting: Anesthesiology

## 2019-02-24 ENCOUNTER — Encounter (HOSPITAL_COMMUNITY)
Admission: RE | Disposition: A | Discharge status: Home / Self Care | Payer: Self-pay | Source: Home / Self Care | Attending: Internal Medicine

## 2019-02-24 DIAGNOSIS — D123 Benign neoplasm of transverse colon: Secondary | ICD-10-CM | POA: Insufficient documentation

## 2019-02-24 DIAGNOSIS — Z791 Long term (current) use of non-steroidal anti-inflammatories (NSAID): Secondary | ICD-10-CM | POA: Diagnosis not present

## 2019-02-24 DIAGNOSIS — Z79899 Other long term (current) drug therapy: Secondary | ICD-10-CM | POA: Diagnosis not present

## 2019-02-24 DIAGNOSIS — K644 Residual hemorrhoidal skin tags: Secondary | ICD-10-CM | POA: Diagnosis not present

## 2019-02-24 DIAGNOSIS — Z1211 Encounter for screening for malignant neoplasm of colon: Secondary | ICD-10-CM | POA: Diagnosis present

## 2019-02-24 DIAGNOSIS — Z87891 Personal history of nicotine dependence: Secondary | ICD-10-CM | POA: Diagnosis not present

## 2019-02-24 DIAGNOSIS — F419 Anxiety disorder, unspecified: Secondary | ICD-10-CM | POA: Insufficient documentation

## 2019-02-24 HISTORY — PX: POLYPECTOMY: SHX5525

## 2019-02-24 HISTORY — PX: COLONOSCOPY WITH PROPOFOL: SHX5780

## 2019-02-24 SURGERY — COLONOSCOPY WITH PROPOFOL
Anesthesia: General

## 2019-02-24 MED ORDER — PROMETHAZINE HCL 25 MG/ML IJ SOLN: 6.2500 mg | INTRAMUSCULAR | Status: DC | PRN

## 2019-02-24 MED ORDER — HYDROMORPHONE HCL 1 MG/ML IJ SOLN: 0.2500 mg | INTRAMUSCULAR | Status: DC | PRN

## 2019-02-24 MED ORDER — LACTATED RINGERS IV SOLN
INTRAVENOUS | Status: DC
  Administered 2019-02-24: 08:00:00 via INTRAVENOUS

## 2019-02-24 MED ORDER — MIDAZOLAM HCL 2 MG/2ML IJ SOLN: 0.5000 mg | Freq: Once | INTRAMUSCULAR | Status: DC | PRN

## 2019-02-24 MED ORDER — PROPOFOL 500 MG/50ML IV EMUL
INTRAVENOUS | Status: DC | PRN
  Administered 2019-02-24: 150 ug/kg/min via INTRAVENOUS

## 2019-02-24 MED ORDER — HYDROCODONE-ACETAMINOPHEN 7.5-325 MG PO TABS: 1.0000 | ORAL_TABLET | Freq: Once | ORAL | Status: DC | PRN

## 2019-02-24 MED ORDER — STERILE WATER FOR IRRIGATION IR SOLN
Status: DC | PRN
  Administered 2019-02-24: 2.5 mL

## 2019-02-24 MED ORDER — PROPOFOL 10 MG/ML IV BOLUS
INTRAVENOUS | Status: DC | PRN
  Administered 2019-02-24 (×2): 20 mg via INTRAVENOUS

## 2019-02-24 NOTE — Anesthesia Preprocedure Evaluation (Signed)
Anesthesia Evaluation  Patient identified by MRN, date of birth, ID band Patient awake    Reviewed: Allergy & Precautions, NPO status , Patient's Chart, lab work & pertinent test results  Airway Mallampati: II  TM Distance: >3 FB Neck ROM: Full    Dental no notable dental hx. (+) Poor Dentition   Pulmonary neg pulmonary ROS, Patient did not abstain from smoking., former smoker,  Denies SOB when questioned  Smoker  Smoked today    Pulmonary exam normal breath sounds clear to auscultation       Cardiovascular Exercise Tolerance: Good negative cardio ROS Normal cardiovascular examI Rhythm:Regular Rate:Normal     Neuro/Psych Anxiety  Neuromuscular disease negative psych ROS   GI/Hepatic negative GI ROS, Neg liver ROS, Bowel prep,  Endo/Other  negative endocrine ROS  Renal/GU negative Renal ROS  negative genitourinary   Musculoskeletal  (+) Fibromyalgia -, narcotic dependent  Abdominal   Peds negative pediatric ROS (+)  Hematology negative hematology ROS (+)   Anesthesia Other Findings   Reproductive/Obstetrics negative OB ROS                             Anesthesia Physical Anesthesia Plan  ASA: II  Anesthesia Plan: General   Post-op Pain Management:    Induction: Intravenous  PONV Risk Score and Plan: TIVA, Propofol infusion, Treatment may vary due to age or medical condition, Ondansetron and Midazolam  Airway Management Planned: Simple Face Mask and Nasal Cannula  Additional Equipment:   Intra-op Plan:   Post-operative Plan:   Informed Consent: I have reviewed the patients History and Physical, chart, labs and discussed the procedure including the risks, benefits and alternatives for the proposed anesthesia with the patient or authorized representative who has indicated his/her understanding and acceptance.     Dental advisory given  Plan Discussed with:  CRNA  Anesthesia Plan Comments: (Plan Full PPE use  Plan GA with GETA as needed d/w pt -WTP with same after Q&A)        Anesthesia Quick Evaluation

## 2019-02-24 NOTE — H&P (Signed)
Amanda Wilcox is an 58 y.o. female.   Chief Complaint: Patient is here for colonoscopy. HPI: Patient is a 58 year old Caucasian female who is undergoing colonoscopy primarily for screening purposes.  She was hospitalized in August 2020 with acute appendicitis with contained perforation and responded to antibiotic therapy.  She is scheduled for appendectomy on 03/08/2019.  He was recommended she should undergo screening exam prior to her surgery.  She denies abdominal pain change in bowel habits or rectal bleeding. Family history is negative for CRC.  Past Medical History:  Diagnosis Date  . Anxiety    Trying to quit smoking - has cut back.  . Arthralgia   . Fatigue   . Neuromuscular disorder (HCC)    fibromyalgia  . Restless leg syndrome         Recent hospitalization for acute appendicitis.       Dilated pancreatic duct.  Past Surgical History:  Procedure Laterality Date  . CARPAL TUNNEL RELEASE Right     Family History  Problem Relation Age of Onset  . Stroke Mother   . Epilepsy Maternal Aunt   . Multiple sclerosis Maternal Uncle   . Heart disease Maternal Grandfather    Social History:  reports that she has quit smoking. Her smoking use included cigarettes. She has a 20.00 pack-year smoking history. She has never used smokeless tobacco. She reports that she does not drink alcohol or use drugs.  Allergies: No Known Allergies  Medications Prior to Admission  Medication Sig Dispense Refill  . cholecalciferol (VITAMIN D3) 25 MCG (1000 UT) tablet Take 1,000 Units by mouth daily.    Marland Kitchen docusate sodium (COLACE) 100 MG capsule Take 1 capsule (100 mg total) by mouth 2 (two) times daily. 60 capsule 2  . DULoxetine (CYMBALTA) 60 MG capsule Take 60 mg by mouth 2 (two) times a day.    . gabapentin (NEURONTIN) 300 MG capsule Take 300 mg by mouth 2 (two) times a day.    Marland Kitchen HYDROcodone-acetaminophen (NORCO/VICODIN) 5-325 MG tablet Take 1 tablet by mouth 2 (two) times daily as needed (for  pain.).     Marland Kitchen meloxicam (MOBIC) 15 MG tablet Take 15 mg by mouth daily.    . modafinil (PROVIGIL) 200 MG tablet Take 200 mg by mouth daily.    . ondansetron (ZOFRAN-ODT) 4 MG disintegrating tablet Take 1 tablet (4 mg total) by mouth every 6 (six) hours as needed for nausea. 20 tablet 0  . oxybutynin (DITROPAN-XL) 5 MG 24 hr tablet Take 5 mg by mouth at bedtime.     . varenicline (CHANTIX) 1 MG tablet Take 1 mg by mouth daily.     Manus Gunning BOWEL PREP KIT 17.5-3.13-1.6 GM/177ML SOLN TAKE 1 KIT BY MOUTH ONCE FOR 1 DOSE      No results found for this or any previous visit (from the past 48 hour(s)). No results found.  ROS  Blood pressure 129/78, pulse 70, temperature 98.3 F (36.8 C), temperature source Oral, resp. rate (!) 23, SpO2 96 %. Physical Exam  Constitutional: She appears well-developed and well-nourished.  HENT:  Mouth/Throat: Oropharynx is clear and moist.  Eyes: Conjunctivae are normal. No scleral icterus.  Neck: No thyromegaly present.  Cardiovascular: Normal rate, regular rhythm and normal heart sounds.  No murmur heard. Respiratory: Effort normal and breath sounds normal.  GI:  Abdomen is full.  On palpation is soft and nontender with organomegaly or masses.  Musculoskeletal:        General: No edema.  Lymphadenopathy:  She has no cervical adenopathy.  Neurological: She is alert.  Skin: Skin is warm and dry.     Assessment/Plan Average risk screening colonoscopy.  Hildred Laser, MD 02/24/2019, 8:11 AM

## 2019-02-24 NOTE — Transfer of Care (Signed)
Immediate Anesthesia Transfer of Care Note  Patient: Amanda Wilcox  Procedure(s) Performed: COLONOSCOPY WITH PROPOFOL (N/A ) POLYPECTOMY  Patient Location: PACU  Anesthesia Type:General  Level of Consciousness: awake  Airway & Oxygen Therapy: Patient Spontanous Breathing  Post-op Assessment: Report given to RN  Post vital signs: Reviewed and stable  Last Vitals:  Vitals Value Taken Time  BP    Temp    Pulse 67 02/24/19 0851  Resp 16 02/24/19 0851  SpO2 99 % 02/24/19 0851  Vitals shown include unvalidated device data.  Last Pain:  Vitals:   02/24/19 0725  TempSrc: Oral  PainSc: 0-No pain         Complications: No apparent anesthesia complications

## 2019-02-24 NOTE — Anesthesia Postprocedure Evaluation (Signed)
Anesthesia Post Note  Patient: Amanda Wilcox  Procedure(s) Performed: COLONOSCOPY WITH PROPOFOL (N/A ) POLYPECTOMY  Patient location during evaluation: PACU Anesthesia Type: General Level of consciousness: awake and alert and oriented Pain management: pain level controlled Vital Signs Assessment: post-procedure vital signs reviewed and stable Respiratory status: spontaneous breathing Cardiovascular status: blood pressure returned to baseline and stable Postop Assessment: no apparent nausea or vomiting Anesthetic complications: no     Last Vitals:  Vitals:   02/24/19 0725 02/24/19 0851  BP: 129/78 (P) 96/66  Pulse: 70 67  Resp: (!) 23 16  Temp: 36.8 C (P) 36.7 C  SpO2: 96% 99%    Last Pain:  Vitals:   02/24/19 0725  TempSrc: Oral  PainSc: 0-No pain                 Kortnie Stovall

## 2019-02-24 NOTE — Op Note (Signed)
[facility] Patient Name: Amanda Wilcox Procedure Date: 02/24/2019 8:18 AM MRN: [id number] Date of Birth: 1960/10/27 Attending MD: Hildred Laser , MD CSN: [id number] Age: 58 Admit Type: Outpatient Procedure:                Colonoscopy Indications:              Screening for colorectal malignant neoplasm Providers:                Hildred Laser, MD, Lurline Del, RN, Nelma Rothman,                            Technician Referring MD:             Cherly Beach, NP and Blake Divine, MD Medicines:                Propofol per Anesthesia Complications:            No immediate complications. Estimated Blood Loss:     Estimated blood loss was minimal. Procedure:                Pre-Anesthesia Assessment:                           - Prior to the procedure, a History and Physical                            was performed, and patient medications and                            allergies were reviewed. The patient's tolerance of                            previous anesthesia was also reviewed. The risks                            and benefits of the procedure and the sedation                            options and risks were discussed with the patient.                            All questions were answered, and informed consent                            was obtained. Prior Anticoagulants: The patient has                            taken no previous anticoagulant or antiplatelet                            agents except for NSAID medication. ASA Grade                            Assessment: II - A patient with mild systemic  disease. After reviewing the risks and benefits,                            the patient was deemed in satisfactory condition to                            undergo the procedure.                           After obtaining informed consent, the colonoscope                            was passed under direct vision. Throughout the   procedure, the patient's blood pressure, pulse, and                            oxygen saturations were monitored continuously. The                            [id number]-[id number] scope was introduced through                            the anus and advanced to the the cecum, identified                            by appendiceal orifice and ileocecal valve. The                            colonoscopy was performed without difficulty. The                            patient tolerated the procedure well. The quality                            of the bowel preparation was marginal. The                            ileocecal valve, appendiceal orifice, and rectum                            were photographed. Scope In: 8:26:41 AM Scope Out: 8:39:37 AM Scope Withdrawal Time: 0 hours 4 minutes 16 seconds  Total Procedure Duration: 0 hours 12 minutes 56 seconds  Findings:      Skin tags were found on perianal exam.      A 5 mm polyp was found in the transverse colon. The polyp was removed       with a cold snare. Resection and retrieval were complete. The pathology       specimen was placed into Bottle Number 1.      A small polyp was found in the mid sigmoid colon. The polyp was sessile.       The polyp was removed with a cold snare. Resection was complete, but the       polyp tissue was not retrieved.      The exam was otherwise normal throughout the examined colon.  External hemorrhoids were found during retroflexion. The hemorrhoids       were small. Impression:               - Perianal skin tags found on perianal exam.                           - One 5 mm polyp in the transverse colon, removed                            with a cold snare. Resected and retrieved.                           - One small polyp in the mid sigmoid colon, removed                            with a cold snare. Complete resection. Polyp tissue                            not retrieved.                           -  External hemorrhoids. Moderate Sedation:      Per Anesthesia Care Recommendation:           - Patient has a contact number available for                            emergencies. The signs and symptoms of potential                            delayed complications were discussed with the                            patient. Return to normal activities tomorrow.                            Written discharge instructions were provided to the                            patient.                           - Resume previous diet today.                           - Continue present medications.                           - No aspirin, ibuprofen, naproxen, or other                            non-steroidal anti-inflammatory drugs for 1 day.                           - Await pathology results.                           -  Repeat colonoscopy is recommended. The                            colonoscopy date will be determined after pathology                            results from today's exam become available for                            review. Procedure Code(s):        --- Professional ---                           [phone], Colonoscopy, flexible; with removal of                            tumor(s), polyp(s), or other lesion(s) by snare                            technique Diagnosis Code(s):        --- Professional ---                           K64.4, Residual hemorrhoidal skin tags                           Z12.11, Encounter for screening for malignant                            neoplasm of colon                           K63.5, Polyp of colon CPT copyright 2019 American Medical Association. All rights reserved. The codes documented in this report are preliminary and upon coder review may  be revised to meet current compliance requirements. Hildred Laser, MD Hildred Laser, MD 02/24/2019 8:47:49 AM This report has been signed electronically. Number of Addenda: 0

## 2019-02-24 NOTE — Discharge Instructions (Signed)
No aspirin or NSAIDs for 24 hours. Resume other medications as before. Resume usual diet. No driving for 24 hours. Physician will call with biopsy results.  Monitored Anesthesia Care, Care After These instructions provide you with information about caring for yourself after your procedure. Your health care provider may also give you more specific instructions. Your treatment has been planned according to current medical practices, but problems sometimes occur. Call your health care provider if you have any problems or questions after your procedure. What can I expect after the procedure? After your procedure, you may:  Feel sleepy for several hours.  Feel clumsy and have poor balance for several hours.  Feel forgetful about what happened after the procedure.  Have poor judgment for several hours.  Feel nauseous or vomit.  Have a sore throat if you had a breathing tube during the procedure. Follow these instructions at home: For at least 24 hours after the procedure:      Have a responsible adult stay with you. It is important to have someone help care for you until you are awake and alert.  Rest as needed.  Do not: ? Participate in activities in which you could fall or become injured. ? Drive. ? Use heavy machinery. ? Drink alcohol. ? Take sleeping pills or medicines that cause drowsiness. ? Make important decisions or sign legal documents. ? Take care of children on your own. Eating and drinking  Follow the diet that is recommended by your health care provider.  If you vomit, drink water, juice, or soup when you can drink without vomiting.  Make sure you have little or no nausea before eating solid foods. General instructions  Take over-the-counter and prescription medicines only as told by your health care provider.  If you have sleep apnea, surgery and certain medicines can increase your risk for breathing problems. Follow instructions from your health care  provider about wearing your sleep device: ? Anytime you are sleeping, including during daytime naps. ? While taking prescription pain medicines, sleeping medicines, or medicines that make you drowsy.  If you smoke, do not smoke without supervision.  Keep all follow-up visits as told by your health care provider. This is important. Contact a health care provider if:  You keep feeling nauseous or you keep vomiting.  You feel light-headed.  You develop a rash.  You have a fever. Get help right away if:  You have trouble breathing. Summary  For several hours after your procedure, you may feel sleepy and have poor judgment.  Have a responsible adult stay with you for at least 24 hours or until you are awake and alert. This information is not intended to replace advice given to you by your health care provider. Make sure you discuss any questions you have with your health care provider. Document Released: 09/01/2015 Document Revised: 08/09/2017 Document Reviewed: 09/01/2015 Elsevier Patient Education  [place].  Colonoscopy, Adult, Care After This sheet gives you information about how to care for yourself after your procedure. Your doctor may also give you more specific instructions. If you have problems or questions, call your doctor. What can I expect after the procedure? After the procedure, it is common to have:  A small amount of blood in your poop for 24 hours.  Some gas.  Mild cramping or bloating in your belly. Follow these instructions at home: General instructions  For the first 24 hours after the procedure: ? Do not drive or use machinery. ? Do not sign  important documents. ? Do not drink alcohol. ? Do your daily activities more slowly than normal. ? Eat foods that are soft and easy to digest.  Take over-the-counter or prescription medicines only as told by your doctor. To help cramping and bloating:   Try walking around.  Put heat on your belly  (abdomen) as told by your doctor. Use a heat source that your doctor recommends, such as a moist heat pack or a heating pad. ? Put a towel between your skin and the heat source. ? Leave the heat on for 20-30 minutes. ? Remove the heat if your skin turns bright red. This is especially important if you cannot feel pain, heat, or cold. You can get burned. Eating and drinking   Drink enough fluid to keep your pee (urine) clear or pale yellow.  Return to your normal diet as told by your doctor. Avoid heavy or fried foods that are hard to digest.  Avoid drinking alcohol for as long as told by your doctor. Contact a doctor if:  You have blood in your poop (stool) 2-3 days after the procedure. Get help right away if:  You have more than a small amount of blood in your poop.  You see large clumps of tissue (blood clots) in your poop.  Your belly is swollen.  You feel sick to your stomach (nauseous).  You throw up (vomit).  You have a fever.  You have belly pain that gets worse, and medicine does not help your pain. Summary  After the procedure, it is common to have a small amount of blood in your poop. You may also have mild cramping and bloating in your belly.  For the first 24 hours after the procedure, do not drive or use machinery, do not sign important documents, and do not drink alcohol.  Get help right away if you have a lot of blood in your poop, feel sick to your stomach, have a fever, or have more belly pain. This information is not intended to replace advice given to you by your health care provider. Make sure you discuss any questions you have with your health care provider. Document Released: 06/13/2010 Document Revised: 03/11/2017 Document Reviewed: 02/03/2016 Elsevier Patient Education  2020 [place].  Colon Polyps  Polyps are tissue growths inside the body. Polyps can grow in many places, including the large intestine (colon). A polyp may be a round bump or a  mushroom-shaped growth. You could have one polyp or several. Most colon polyps are noncancerous (benign). However, some colon polyps can become cancerous over time. Finding and removing the polyps early can help prevent this. What are the causes? The exact cause of colon polyps is not known. What increases the risk? You are more likely to develop this condition if you:  Have a family history of colon cancer or colon polyps.  Are older than 85 or older than 45 if you are African American.  Have inflammatory bowel disease, such as ulcerative colitis or Crohn's disease.  Have certain hereditary conditions, such as: ? Familial adenomatous polyposis. ? Lynch syndrome. ? Turcot syndrome. ? Peutz-Jeghers syndrome.  Are overweight.  Smoke cigarettes.  Do not get enough exercise.  Drink too much alcohol.  Eat a diet that is high in fat and red meat and low in fiber.  Had childhood cancer that was treated with abdominal radiation. What are the signs or symptoms? Most polyps do not cause symptoms. If you have symptoms, they may include:  Blood  coming from your rectum when having a bowel movement.  Blood in your stool. The stool may look dark red or black.  Abdominal pain.  A change in bowel habits, such as constipation or diarrhea. How is this diagnosed? This condition is diagnosed with a colonoscopy. This is a procedure in which a lighted, flexible scope is inserted into the anus and then passed into the colon to examine the area. Polyps are sometimes found when a colonoscopy is done as part of routine cancer screening tests. How is this treated? Treatment for this condition involves removing any polyps that are found. Most polyps can be removed during a colonoscopy. Those polyps will then be tested for cancer. Additional treatment may be needed depending on the results of testing. Follow these instructions at home: Lifestyle  Maintain a healthy weight, or lose weight if  recommended by your health care provider.  Exercise every day or as told by your health care provider.  Do not use any products that contain nicotine or tobacco, such as cigarettes and e-cigarettes. If you need help quitting, ask your health care provider.  If you drink alcohol, limit how much you have: ? 0-1 drink a day for women. ? 0-2 drinks a day for men.  Be aware of how much alcohol is in your drink. In the [place] one drink equals one 12 oz bottle of beer (355 mL), one 5 oz glass of wine (148 mL), or one 1 oz shot of hard liquor (44 mL). Eating and drinking   Eat foods that are high in fiber, such as fruits, vegetables, and whole grains.  Eat foods that are high in calcium and vitamin D, such as milk, cheese, yogurt, eggs, liver, fish, and broccoli.  Limit foods that are high in fat, such as fried foods and desserts.  Limit the amount of red meat and processed meat you eat, such as hot dogs, sausage, bacon, and lunch meats. General instructions  Keep all follow-up visits as told by your health care provider. This is important. ? This includes having regularly scheduled colonoscopies. ? Talk to your health care provider about when you need a colonoscopy. Contact a health care provider if:  You have new or worsening bleeding during a bowel movement.  You have new or increased blood in your stool.  You have a change in bowel habits.  You lose weight for no known reason. Summary  Polyps are tissue growths inside the body. Polyps can grow in many places, including the colon.  Most colon polyps are noncancerous (benign), but some can become cancerous over time.  This condition is diagnosed with a colonoscopy.  Treatment for this condition involves removing any polyps that are found. Most polyps can be removed during a colonoscopy. This information is not intended to replace advice given to you by your health care provider. Make sure you discuss any questions you have with  your health care provider. Document Released: 02/05/2004 Document Revised: 08/26/2017 Document Reviewed: 08/26/2017 Elsevier Patient Education  2020 [employer].

## 2019-02-27 LAB — SURGICAL PATHOLOGY

## 2019-03-01 ENCOUNTER — Encounter (INDEPENDENT_AMBULATORY_CARE_PROVIDER_SITE_OTHER): Payer: Self-pay | Admitting: *Deleted

## 2019-03-02 NOTE — Progress Notes (Signed)
Please try to call Ms Stukey and let her know  Patient had single small tubular adenoma removed.  Next colonoscopy in 5 years. As per Dr Laural Golden.

## 2019-03-03 ENCOUNTER — Encounter (HOSPITAL_COMMUNITY): Payer: Self-pay | Admitting: Internal Medicine

## 2019-03-06 ENCOUNTER — Encounter (HOSPITAL_COMMUNITY): Payer: Self-pay

## 2019-03-06 ENCOUNTER — Encounter (HOSPITAL_COMMUNITY)
Admission: RE | Admit: 2019-03-06 | Discharge: 2019-03-06 | Disposition: A | Discharge status: Home / Self Care | Payer: BC Managed Care – PPO | Source: Ambulatory Visit | Attending: General Surgery | Admitting: General Surgery

## 2019-03-06 ENCOUNTER — Other Ambulatory Visit: Payer: Self-pay

## 2019-03-06 ENCOUNTER — Other Ambulatory Visit (HOSPITAL_COMMUNITY)
Admission: RE | Admit: 2019-03-06 | Discharge: 2019-03-06 | Disposition: A | Discharge status: Home / Self Care | Payer: BC Managed Care – PPO | Source: Ambulatory Visit | Attending: General Surgery | Admitting: General Surgery

## 2019-03-06 DIAGNOSIS — K3532 Acute appendicitis with perforation and localized peritonitis, without abscess: Secondary | ICD-10-CM | POA: Diagnosis not present

## 2019-03-06 DIAGNOSIS — Z20828 Contact with and (suspected) exposure to other viral communicable diseases: Secondary | ICD-10-CM | POA: Insufficient documentation

## 2019-03-06 DIAGNOSIS — Z01812 Encounter for preprocedural laboratory examination: Secondary | ICD-10-CM | POA: Diagnosis not present

## 2019-03-06 HISTORY — DX: Sleep apnea, unspecified: G47.30

## 2019-03-06 LAB — SARS CORONAVIRUS 2 (TAT 6-24 HRS): SARS Coronavirus 2: NEGATIVE

## 2019-03-06 NOTE — Patient Instructions (Signed)
Your procedure is scheduled on: 03/08/2019  Report to [facility] at 7:00    AM.  Call this number if you have problems the morning of surgery: [phone]   Remember:   Do not Eat or Drink after midnight   :  Take these medicines the morning of surgery with A SIP OF WATER:Cymbalta, Gabapentin, Ditropan, zofran if needed    Do not wear jewelry, make-up or nail polish.  Do not wear lotions, powders, or perfumes. You may wear deodorant.  Do not shave 48 hours prior to surgery. Men may shave face and neck.  Do not bring valuables to the hospital.  Contacts, dentures or bridgework may not be worn into surgery.  Leave suitcase in the car. After surgery it may be brought to your room.  For patients admitted to the hospital, checkout time is 11:00 AM the day of discharge.   Patients discharged the day of surgery will not be allowed to drive home.    Special Instructions: Shower using CHG night before surgery and shower the day of surgery use CHG.  Use special wash - you have one bottle of CHG for all showers.  You should use approximately 1/2 of the bottle for each shower.  Laparoscopic Appendectomy, Adult, Care After This sheet gives you information about how to care for yourself after your procedure. Your doctor may also give you more specific instructions. If you have problems or questions, contact your doctor. What can I expect after the procedure? After the procedure, it is common to have:  Little energy for normal activities.  Mild pain in the area where the cuts from surgery (incisions) were made.  Trouble pooping (constipation). This can be caused by: ? Pain medicine. ? A lack of activity. Follow these instructions at home: Medicines  Take over-the-counter and prescription medicines only as told by your doctor.  If you were prescribed an antibiotic medicine, take it as told by your doctor. Do not stop taking it even if you start to feel better.  Do not drive or use heavy  machinery while taking prescription pain medicine.  Ask your doctor if the medicine you are taking can cause trouble pooping. You may need to take steps to prevent or treat trouble pooping: ? Drink enough fluid to keep your pee (urine) pale yellow. ? Take over-the-counter or prescription medicines. ? Eat foods that are high in fiber. These include beans, whole grains, and fresh fruits and vegetables. ? Limit foods that are high in fat and sugar. These include fried or sweet foods. Incision care   Follow instructions from your doctor about how to take care of your cuts from surgery. Make sure you: ? Wash your hands with soap and water before and after you change your bandage (dressing). If you cannot use soap and water, use hand sanitizer. ? Change your bandage as told by your doctor. ? Leave stitches (sutures), skin glue, or skin tape (adhesive) strips in place. They may need to stay in place for 2 weeks or longer. If tape strips get loose and curl up, you may trim the loose edges. Do not remove tape strips completely unless your doctor says it is okay.  Check your cuts from surgery every day for signs of infection. Check for: ? Redness, swelling, or pain. ? Fluid or blood. ? Warmth. ? Pus or a bad smell. Bathing  Keep your cuts from surgery clean and dry. Clean them as told by your doctor. To do this: 1. Gently  wash the cuts with soap and water. 2. Rinse the cuts with water to remove all soap. 3. Pat the cuts dry with a clean towel. Do not rub the cuts.  Do not take baths, swim, or use a hot tub for 2 weeks, or until your doctor says it is okay. You may take showers after 48 hours. Activity   Do not drive for 24 hours if you were given a medicine to help you relax (sedative) during your procedure.  Rest after the procedure. Return to your normal activities as told by your doctor. Ask your doctor what activities are safe for you.  For 3 weeks, or for as long as told by your  doctor: ? Do not lift anything that is heavier than 10 lb (4.5 kg), or the limit that you are told. ? Do not play contact sports. General instructions  If you were sent home with a drain, follow instructions from your doctor on how to care for it.  Take deep breaths. This helps to keep your lungs from getting an infection (pneumonia).  Keep all follow-up visits as told by your doctor. This is important. Contact a doctor if:  You have redness, swelling, or pain around a cut from surgery.  You have fluid or blood coming from a cut.  Your cut feels warm to the touch.  You have pus or a bad smell coming from a cut or a bandage.  The edges of a cut break open after the stitches have been taken out.  You have pain in your shoulders that gets worse.  You feel dizzy or you pass out (faint).  You have shortness of breath.  You keep feeling sick to your stomach (nauseous).  You keep throwing up (vomiting).  You get watery poop (diarrhea) or you cannot control your poop.  You lose your appetite.  You have swelling or pain in your legs.  You get a rash. Get help right away if:  You have a fever.  You have trouble breathing.  You have sharp pains in your chest. Summary  After the procedure, it is common to have low energy, mild pain, and trouble pooping.  Infection is a common problem after this procedure. Follow your doctor's instructions about caring for yourself after the procedure.  Rest after the procedure. Return to your normal activities as told by your doctor.  Contact your doctor if you see signs of infection around your cuts from surgery, or you get short of breath. Get help right away if you have a fever, chest pain, or trouble breathing. This information is not intended to replace advice given to you by your health care provider. Make sure you discuss any questions you have with your health care provider. Document Released: 03/07/2009 Document Revised:  11/11/2017 Document Reviewed: 11/11/2017 Elsevier Patient Education  2020 [place] Anesthesia, Adult, Care After This sheet gives you information about how to care for yourself after your procedure. Your health care provider may also give you more specific instructions. If you have problems or questions, contact your health care provider. What can I expect after the procedure? After the procedure, the following side effects are common:  Pain or discomfort at the IV site.  Nausea.  Vomiting.  Sore throat.  Trouble concentrating.  Feeling cold or chills.  Weak or tired.  Sleepiness and fatigue.  Soreness and body aches. These side effects can affect parts of the body that were not involved in surgery. Follow these instructions  at home:  For at least 24 hours after the procedure:  Have a responsible adult stay with you. It is important to have someone help care for you until you are awake and alert.  Rest as needed.  Do not: ? Participate in activities in which you could fall or become injured. ? Drive. ? Use heavy machinery. ? Drink alcohol. ? Take sleeping pills or medicines that cause drowsiness. ? Make important decisions or sign legal documents. ? Take care of children on your own. Eating and drinking  Follow any instructions from your health care provider about eating or drinking restrictions.  When you feel hungry, start by eating small amounts of foods that are soft and easy to digest (bland), such as toast. Gradually return to your regular diet.  Drink enough fluid to keep your urine pale yellow.  If you vomit, rehydrate by drinking water, juice, or clear broth. General instructions  If you have sleep apnea, surgery and certain medicines can increase your risk for breathing problems. Follow instructions from your health care provider about wearing your sleep device: ? Anytime you are sleeping, including during daytime naps. ? While taking  prescription pain medicines, sleeping medicines, or medicines that make you drowsy.  Return to your normal activities as told by your health care provider. Ask your health care provider what activities are safe for you.  Take over-the-counter and prescription medicines only as told by your health care provider.  If you smoke, do not smoke without supervision.  Keep all follow-up visits as told by your health care provider. This is important. Contact a health care provider if:  You have nausea or vomiting that does not get better with medicine.  You cannot eat or drink without vomiting.  You have pain that does not get better with medicine.  You are unable to pass urine.  You develop a skin rash.  You have a fever.  You have redness around your IV site that gets worse. Get help right away if:  You have difficulty breathing.  You have chest pain.  You have blood in your urine or stool, or you vomit blood. Summary  After the procedure, it is common to have a sore throat or nausea. It is also common to feel tired.  Have a responsible adult stay with you for the first 24 hours after general anesthesia. It is important to have someone help care for you until you are awake and alert.  When you feel hungry, start by eating small amounts of foods that are soft and easy to digest (bland), such as toast. Gradually return to your regular diet.  Drink enough fluid to keep your urine pale yellow.  Return to your normal activities as told by your health care provider. Ask your health care provider what activities are safe for you. This information is not intended to replace advice given to you by your health care provider. Make sure you discuss any questions you have with your health care provider. Document Released: 08/17/2000 Document Revised: 05/14/2017 Document Reviewed: 12/25/2016 Elsevier Patient Education  2020 [employer].

## 2019-03-08 ENCOUNTER — Encounter (HOSPITAL_COMMUNITY)
Admission: RE | Disposition: A | Discharge status: Home / Self Care | Payer: Self-pay | Source: Home / Self Care | Attending: General Surgery

## 2019-03-08 ENCOUNTER — Encounter (HOSPITAL_COMMUNITY): Payer: Self-pay | Admitting: *Deleted

## 2019-03-08 ENCOUNTER — Ambulatory Visit (HOSPITAL_COMMUNITY)
Admission: RE | Admit: 2019-03-08 | Discharge: 2019-03-08 | Disposition: A | Discharge status: Home / Self Care | Payer: BC Managed Care – PPO | Attending: General Surgery | Admitting: General Surgery

## 2019-03-08 ENCOUNTER — Ambulatory Visit (HOSPITAL_COMMUNITY): Payer: BC Managed Care – PPO | Admitting: Anesthesiology

## 2019-03-08 DIAGNOSIS — G4730 Sleep apnea, unspecified: Secondary | ICD-10-CM | POA: Insufficient documentation

## 2019-03-08 DIAGNOSIS — Z87891 Personal history of nicotine dependence: Secondary | ICD-10-CM | POA: Insufficient documentation

## 2019-03-08 DIAGNOSIS — M797 Fibromyalgia: Secondary | ICD-10-CM | POA: Diagnosis not present

## 2019-03-08 DIAGNOSIS — K660 Peritoneal adhesions (postprocedural) (postinfection): Secondary | ICD-10-CM | POA: Diagnosis not present

## 2019-03-08 DIAGNOSIS — Z79899 Other long term (current) drug therapy: Secondary | ICD-10-CM | POA: Diagnosis not present

## 2019-03-08 DIAGNOSIS — F419 Anxiety disorder, unspecified: Secondary | ICD-10-CM | POA: Insufficient documentation

## 2019-03-08 DIAGNOSIS — Z791 Long term (current) use of non-steroidal anti-inflammatories (NSAID): Secondary | ICD-10-CM | POA: Insufficient documentation

## 2019-03-08 DIAGNOSIS — K388 Other specified diseases of appendix: Secondary | ICD-10-CM | POA: Insufficient documentation

## 2019-03-08 DIAGNOSIS — K3532 Acute appendicitis with perforation and localized peritonitis, without abscess: Secondary | ICD-10-CM | POA: Diagnosis present

## 2019-03-08 DIAGNOSIS — R109 Unspecified abdominal pain: Secondary | ICD-10-CM | POA: Diagnosis present

## 2019-03-08 HISTORY — PX: LAPAROSCOPIC APPENDECTOMY: SHX408

## 2019-03-08 SURGERY — APPENDECTOMY, LAPAROSCOPIC
Anesthesia: General | Site: Abdomen | Laterality: Right

## 2019-03-08 MED ORDER — PROPOFOL 10 MG/ML IV BOLUS
INTRAVENOUS | Status: AC
  Filled 2019-03-08: qty 20

## 2019-03-08 MED ORDER — FENTANYL CITRATE (PF) 250 MCG/5ML IJ SOLN
INTRAMUSCULAR | Status: AC
  Filled 2019-03-08: qty 5

## 2019-03-08 MED ORDER — SODIUM CHLORIDE 0.9 % IV SOLN
INTRAVENOUS | Status: AC
  Filled 2019-03-08: qty 2

## 2019-03-08 MED ORDER — MIDAZOLAM HCL 2 MG/2ML IJ SOLN
INTRAMUSCULAR | Status: AC
  Filled 2019-03-08: qty 2

## 2019-03-08 MED ORDER — CHLORHEXIDINE GLUCONATE CLOTH 2 % EX PADS: 6.0000 | MEDICATED_PAD | Freq: Once | CUTANEOUS | Status: DC

## 2019-03-08 MED ORDER — ONDANSETRON HCL 4 MG/2ML IJ SOLN
INTRAMUSCULAR | Status: DC | PRN
  Administered 2019-03-08: 4 mg via INTRAVENOUS

## 2019-03-08 MED ORDER — DEXAMETHASONE SODIUM PHOSPHATE 10 MG/ML IJ SOLN
INTRAMUSCULAR | Status: AC
  Filled 2019-03-08: qty 1

## 2019-03-08 MED ORDER — MIDAZOLAM HCL 5 MG/5ML IJ SOLN
INTRAMUSCULAR | Status: DC | PRN
  Administered 2019-03-08: 2 mg via INTRAVENOUS

## 2019-03-08 MED ORDER — BUPIVACAINE HCL (PF) 0.5 % IJ SOLN
INTRAMUSCULAR | Status: DC | PRN
  Administered 2019-03-08: 15 mL

## 2019-03-08 MED ORDER — FENTANYL CITRATE (PF) 100 MCG/2ML IJ SOLN
INTRAMUSCULAR | Status: DC | PRN
  Administered 2019-03-08: 100 ug via INTRAVENOUS
  Administered 2019-03-08: 50 ug via INTRAVENOUS
  Administered 2019-03-08: 100 ug via INTRAVENOUS

## 2019-03-08 MED ORDER — FENTANYL CITRATE (PF) 100 MCG/2ML IJ SOLN
INTRAMUSCULAR | Status: AC
  Filled 2019-03-08: qty 2

## 2019-03-08 MED ORDER — PROPOFOL 10 MG/ML IV BOLUS
INTRAVENOUS | Status: DC | PRN
  Administered 2019-03-08: 200 mg via INTRAVENOUS

## 2019-03-08 MED ORDER — ROCURONIUM BROMIDE 10 MG/ML (PF) SYRINGE
PREFILLED_SYRINGE | INTRAVENOUS | Status: AC
  Filled 2019-03-08: qty 10

## 2019-03-08 MED ORDER — 0.9 % SODIUM CHLORIDE (POUR BTL) OPTIME
TOPICAL | Status: DC | PRN
  Administered 2019-03-08: 1000 mL

## 2019-03-08 MED ORDER — PROMETHAZINE HCL 25 MG/ML IJ SOLN: 6.2500 mg | INTRAMUSCULAR | Status: DC | PRN

## 2019-03-08 MED ORDER — HYDROMORPHONE HCL 1 MG/ML IJ SOLN
0.2500 mg | INTRAMUSCULAR | Status: DC | PRN
  Administered 2019-03-08: 0.5 mg via INTRAVENOUS
  Filled 2019-03-08: qty 0.5

## 2019-03-08 MED ORDER — OXYCODONE HCL 5 MG PO TABS: 5.0000 mg | ORAL_TABLET | ORAL | 0 refills | Status: AC | PRN

## 2019-03-08 MED ORDER — SODIUM CHLORIDE 0.9 % IV SOLN
2.0000 g | INTRAVENOUS | Status: AC
  Administered 2019-03-08: 2 g via INTRAVENOUS

## 2019-03-08 MED ORDER — SUGAMMADEX SODIUM 500 MG/5ML IV SOLN
INTRAVENOUS | Status: AC
  Filled 2019-03-08: qty 5

## 2019-03-08 MED ORDER — PHENYLEPHRINE 40 MCG/ML (10ML) SYRINGE FOR IV PUSH (FOR BLOOD PRESSURE SUPPORT)
PREFILLED_SYRINGE | INTRAVENOUS | Status: AC
  Filled 2019-03-08: qty 30

## 2019-03-08 MED ORDER — ONDANSETRON HCL 4 MG/2ML IJ SOLN
INTRAMUSCULAR | Status: AC
  Filled 2019-03-08: qty 2

## 2019-03-08 MED ORDER — ROCURONIUM BROMIDE 100 MG/10ML IV SOLN
INTRAVENOUS | Status: DC | PRN
  Administered 2019-03-08: 60 mg via INTRAVENOUS
  Administered 2019-03-08: 40 mg via INTRAVENOUS

## 2019-03-08 MED ORDER — ALBUTEROL SULFATE (2.5 MG/3ML) 0.083% IN NEBU
INHALATION_SOLUTION | RESPIRATORY_TRACT | Status: AC
  Filled 2019-03-08: qty 3

## 2019-03-08 MED ORDER — HYDROCODONE-ACETAMINOPHEN 7.5-325 MG PO TABS
1.0000 | ORAL_TABLET | Freq: Once | ORAL | Status: AC | PRN
  Administered 2019-03-08: 1 via ORAL
  Filled 2019-03-08: qty 1

## 2019-03-08 MED ORDER — PHENYLEPHRINE HCL (PRESSORS) 10 MG/ML IV SOLN
INTRAVENOUS | Status: DC | PRN
  Administered 2019-03-08: 100 ug via INTRAVENOUS
  Administered 2019-03-08 (×3): 200 ug via INTRAVENOUS
  Administered 2019-03-08: 100 ug via INTRAVENOUS

## 2019-03-08 MED ORDER — LIDOCAINE HCL (CARDIAC) PF 100 MG/5ML IV SOSY
PREFILLED_SYRINGE | INTRAVENOUS | Status: DC | PRN
  Administered 2019-03-08: 60 mg via INTRATRACHEAL

## 2019-03-08 MED ORDER — LIDOCAINE 2% (20 MG/ML) 5 ML SYRINGE
INTRAMUSCULAR | Status: AC
  Filled 2019-03-08: qty 5

## 2019-03-08 MED ORDER — ALBUTEROL SULFATE (2.5 MG/3ML) 0.083% IN NEBU
2.5000 mg | INHALATION_SOLUTION | Freq: Four times a day (QID) | RESPIRATORY_TRACT | Status: DC | PRN
  Administered 2019-03-08 (×2): 2.5 mg via RESPIRATORY_TRACT
  Filled 2019-03-08: qty 3

## 2019-03-08 MED ORDER — SUGAMMADEX SODIUM 500 MG/5ML IV SOLN
INTRAVENOUS | Status: DC | PRN
  Administered 2019-03-08: 500 mg via INTRAVENOUS

## 2019-03-08 MED ORDER — DEXAMETHASONE SODIUM PHOSPHATE 4 MG/ML IJ SOLN
INTRAMUSCULAR | Status: DC | PRN
  Administered 2019-03-08: 4 mg via INTRAVENOUS

## 2019-03-08 MED ORDER — MIDAZOLAM HCL 2 MG/2ML IJ SOLN: 0.5000 mg | Freq: Once | INTRAMUSCULAR | Status: DC | PRN

## 2019-03-08 MED ORDER — BUPIVACAINE HCL (PF) 0.5 % IJ SOLN
INTRAMUSCULAR | Status: AC
  Filled 2019-03-08: qty 30

## 2019-03-08 MED ORDER — EPHEDRINE SULFATE 50 MG/ML IJ SOLN
INTRAMUSCULAR | Status: DC | PRN
  Administered 2019-03-08 (×3): 10 mg via INTRAVENOUS

## 2019-03-08 MED ORDER — LACTATED RINGERS IV SOLN
INTRAVENOUS | Status: DC
  Administered 2019-03-08: 07:00:00 1000 mL via INTRAVENOUS
  Administered 2019-03-08: 10:00:00 via INTRAVENOUS

## 2019-03-08 SURGICAL SUPPLY — 42 items
BAG RETRIEVAL 10 (BASKET) ×2
BLADE SURG 15 STRL LF DISP TIS (BLADE) ×1 IMPLANT
BLADE SURG 15 STRL SS (BLADE) ×1
CHLORAPREP W/TINT 26 (MISCELLANEOUS) ×2 IMPLANT
CLOTH BEACON ORANGE TIMEOUT ST (SAFETY) ×2 IMPLANT
COVER LIGHT HANDLE STERIS (MISCELLANEOUS) ×4 IMPLANT
COVER WAND RF STERILE (DRAPES) ×2 IMPLANT
CUTTER FLEX LINEAR 45M (STAPLE) ×2 IMPLANT
DECANTER SPIKE VIAL GLASS SM (MISCELLANEOUS) ×2 IMPLANT
DERMABOND ADVANCED (GAUZE/BANDAGES/DRESSINGS) ×1
DERMABOND ADVANCED .7 DNX12 (GAUZE/BANDAGES/DRESSINGS) ×1 IMPLANT
ELECT REM PT RETURN 9FT ADLT (ELECTROSURGICAL) ×2
ELECTRODE REM PT RTRN 9FT ADLT (ELECTROSURGICAL) ×1 IMPLANT
GLOVE BIO SURGEON STRL SZ 6.5 (GLOVE) ×4 IMPLANT
GLOVE BIO SURGEON STRL SZ7 (GLOVE) ×2 IMPLANT
GLOVE BIOGEL PI IND STRL 6.5 (GLOVE) ×1 IMPLANT
GLOVE BIOGEL PI IND STRL 7.0 (GLOVE) ×3 IMPLANT
GLOVE BIOGEL PI INDICATOR 6.5 (GLOVE) ×1
GLOVE BIOGEL PI INDICATOR 7.0 (GLOVE) ×3
GOWN STRL REUS W/TWL LRG LVL3 (GOWN DISPOSABLE) ×4 IMPLANT
INST SET LAPROSCOPIC AP (KITS) ×2 IMPLANT
KIT TURNOVER KIT A (KITS) ×2 IMPLANT
MANIFOLD NEPTUNE II (INSTRUMENTS) ×2 IMPLANT
NEEDLE INSUFFLATION 14GA 120MM (NEEDLE) ×2 IMPLANT
NS IRRIG 1000ML POUR BTL (IV SOLUTION) ×2 IMPLANT
PACK LAP CHOLE LZT030E (CUSTOM PROCEDURE TRAY) ×2 IMPLANT
PAD ARMBOARD 7.5X6 YLW CONV (MISCELLANEOUS) ×2 IMPLANT
RELOAD STAPLE TA45 3.5 REG BLU (ENDOMECHANICALS) ×4 IMPLANT
SET BASIN LINEN APH (SET/KITS/TRAYS/PACK) ×2 IMPLANT
SET TUBE SMOKE EVAC HIGH FLOW (TUBING) ×2 IMPLANT
SLEEVE ENDOPATH XCEL 5M (ENDOMECHANICALS) ×2 IMPLANT
SUT MNCRL AB 4-0 PS2 18 (SUTURE) ×2 IMPLANT
SUT VICRYL 0 UR6 27IN ABS (SUTURE) ×2 IMPLANT
SYS BAG RETRIEVAL 10MM (BASKET) ×2
SYSTEM BAG RETRIEVAL 10MM (BASKET) ×2 IMPLANT
TRAY FOLEY W/BAG SLVR 16FR (SET/KITS/TRAYS/PACK) ×1
TRAY FOLEY W/BAG SLVR 16FR ST (SET/KITS/TRAYS/PACK) ×1 IMPLANT
TROCAR ENDO BLADELESS 11MM (ENDOMECHANICALS) ×2 IMPLANT
TROCAR ENDO BLADELESS 12MM (ENDOMECHANICALS) ×2 IMPLANT
TROCAR XCEL NON-BLD 5MMX100MML (ENDOMECHANICALS) ×2 IMPLANT
WARMER LAPAROSCOPE (MISCELLANEOUS) ×2 IMPLANT
YANKAUER SUCT 12FT TUBE ARGYLE (SUCTIONS) ×2 IMPLANT

## 2019-03-08 NOTE — Discharge Instructions (Signed)
Discharge Laparoscopic Surgery Instructions:  Common Complaints: Right shoulder pain is common after laparoscopic surgery. This is secondary to the gas used in the surgery being trapped under the diaphragm.  Walk to help your body absorb the gas. This will improve in a few days. Pain at the port sites are common, especially the larger port sites. This will improve with time.  Some nausea is common and poor appetite. The main goal is to stay hydrated the first few days after surgery.  Your intestines still looked unhappy from the prior infection, so you may have a worse time with nausea after surgery. Stay hydrated, but if you are vomiting and cannot keep anything down, that is a reason to come to the ED. If you have any signs of fever, chills, or severe abdominal pain that is not controlled with standard medications then that is a reason to come to the ED.   Diet/ Activity: Diet as tolerated. You may not have an appetite, but it is important to stay hydrated. Drink 64 ounces of water a day. Your appetite will return with time.  Shower per your regular routine daily.  Do not take hot showers. Take warm showers that are less than 10 minutes. Rest and listen to your body, but do not remain in bed all day.  Walk everyday for at least 15-20 minutes. Deep cough and move around every 1-2 hours in the first few days after surgery.  Do not lift > 10 lbs, perform excessive bending, pushing, pulling, squatting for 1-2 weeks after surgery.  Do not pick at the dermabond glue on your incision sites.  This glue film will remain in place for 1-2 weeks and will start to peel off.  Do not place lotions or balms on your incision unless instructed to specifically by Dr. Constance Haw.   Medication: Take tylenol and ibuprofen as needed for pain control, alternating every 4-6 hours.  Example:  Tylenol 1000mg  @ 6am, 12noon, 6pm, 77midnight (Do not exceed 4000mg  of tylenol a day). Ibuprofen 800mg  @ 9am, 3pm, 9pm, 3am (Do not  exceed 3600mg  of ibuprofen a day).  Take your normal Norco as prescribed, but do not exceed 4000 mg of tylenol a day (your Norco has 325mg  per pill.  Take Roxicodone for breakthrough pain every 4 hours.  I have notified Myrtie Cruise, PA office and left a message so they will be aware of this extra pain medication.  Take Colace for constipation related to narcotic pain medication. If you do not have a bowel movement in 2 days, take Miralax over the counter.  Drink plenty of water to also prevent constipation.   Contact Information: If you have questions or concerns, please call our office, [phone], Monday- Thursday 8AM-5PM and Friday 8AM-12Noon.  If it is after hours or on the weekend, please call [facility] Main Number, [phone], and ask to speak to the surgeon on call for Dr. Constance Haw at [facility].    Laparoscopic Appendectomy, Adult, Care After This sheet gives you information about how to care for yourself after your procedure. Your doctor may also give you more specific instructions. If you have problems or questions, contact your doctor. What can I expect after the procedure? After the procedure, it is common to have:  Little energy for normal activities.  Mild pain in the area where the cuts from surgery (incisions) were made.  Trouble pooping (constipation). This can be caused by: ? Pain medicine. ? A lack of activity. Follow these instructions at home: Medicines  Take over-the-counter and prescription medicines only as told by your doctor.  If you were prescribed an antibiotic medicine, take it as told by your doctor. Do not stop taking it even if you start to feel better.  Do not drive or use heavy machinery while taking prescription pain medicine.  Ask your doctor if the medicine you are taking can cause trouble pooping. You may need to take steps to prevent or treat trouble pooping: ? Drink enough fluid to keep your pee (urine) pale yellow. ? Take over-the-counter  or prescription medicines. ? Eat foods that are high in fiber. These include beans, whole grains, and fresh fruits and vegetables. ? Limit foods that are high in fat and sugar. These include fried or sweet foods. Incision care   Follow instructions from your doctor about how to take care of your cuts from surgery. Make sure you: ? Wash your hands with soap and water before and after you change your bandage (dressing). If you cannot use soap and water, use hand sanitizer. ? Change your bandage as told by your doctor. ? Leave stitches (sutures), skin glue, or skin tape (adhesive) strips in place. They may need to stay in place for 2 weeks or longer. If tape strips get loose and curl up, you may trim the loose edges. Do not remove tape strips completely unless your doctor says it is okay.  Check your cuts from surgery every day for signs of infection. Check for: ? Redness, swelling, or pain. ? Fluid or blood. ? Warmth. ? Pus or a bad smell. Bathing  Keep your cuts from surgery clean and dry. Clean them as told by your doctor. To do this: 1. Gently wash the cuts with soap and water. 2. Rinse the cuts with water to remove all soap. 3. Pat the cuts dry with a clean towel. Do not rub the cuts.  Do not take baths, swim, or use a hot tub for 2 weeks, or until your doctor says it is okay. You may take showers after 48 hours. Activity   Do not drive for 24 hours if you were given a medicine to help you relax (sedative) during your procedure.  Rest after the procedure. Return to your normal activities as told by your doctor. Ask your doctor what activities are safe for you.  For 3 weeks, or for as long as told by your doctor: ? Do not lift anything that is heavier than 10 lb (4.5 kg), or the limit that you are told. ? Do not play contact sports. General instructions  If you were sent home with a drain, follow instructions from your doctor on how to care for it.  Take deep breaths. This  helps to keep your lungs from getting an infection (pneumonia).  Keep all follow-up visits as told by your doctor. This is important. Contact a doctor if:  You have redness, swelling, or pain around a cut from surgery.  You have fluid or blood coming from a cut.  Your cut feels warm to the touch.  You have pus or a bad smell coming from a cut or a bandage.  The edges of a cut break open after the stitches have been taken out.  You have pain in your shoulders that gets worse.  You feel dizzy or you pass out (faint).  You have shortness of breath.  You keep feeling sick to your stomach (nauseous).  You keep throwing up (vomiting).  You get watery poop (diarrhea) or you  cannot control your poop.  You lose your appetite.  You have swelling or pain in your legs.  You get a rash. Get help right away if:  You have a fever.  You have trouble breathing.  You have sharp pains in your chest. Summary  After the procedure, it is common to have low energy, mild pain, and trouble pooping.  Infection is a common problem after this procedure. Follow your doctor's instructions about caring for yourself after the procedure.  Rest after the procedure. Return to your normal activities as told by your doctor.  Contact your doctor if you see signs of infection around your cuts from surgery, or you get short of breath. Get help right away if you have a fever, chest pain, or trouble breathing. This information is not intended to replace advice given to you by your health care provider. Make sure you discuss any questions you have with your health care provider. Document Released: 03/07/2009 Document Revised: 11/11/2017 Document Reviewed: 11/11/2017 Elsevier Patient Education  2020 [employer].

## 2019-03-08 NOTE — Anesthesia Preprocedure Evaluation (Signed)
Anesthesia Evaluation  Patient identified by MRN, date of birth, ID band Patient awake    Reviewed: Allergy & Precautions, NPO status , Patient's Chart, lab work & pertinent test results  Airway Mallampati: II  TM Distance: >3 FB Neck ROM: Full    Dental no notable dental hx. (+) Teeth Intact   Pulmonary shortness of breath and with exertion, sleep apnea and Continuous Positive Airway Pressure Ventilation , former smoker,  Quit smoking ~1 week ago Denies inhaler use  Reports CPAP use for OSA   Pulmonary exam normal breath sounds clear to auscultation       Cardiovascular Exercise Tolerance: Good negative cardio ROS Normal cardiovascular examI Rhythm:Regular Rate:Normal  States ET limited by LBP issues - on chronic pain meds    Neuro/Psych Anxiety  Neuromuscular disease negative psych ROS   GI/Hepatic negative GI ROS, Neg liver ROS,   Endo/Other  negative endocrine ROS  Renal/GU negative Renal ROS  negative genitourinary   Musculoskeletal  (+) Fibromyalgia -, narcotic dependent  Abdominal   Peds negative pediatric ROS (+)  Hematology negative hematology ROS (+)   Anesthesia Other Findings   Reproductive/Obstetrics negative OB ROS                             Anesthesia Physical Anesthesia Plan  ASA: II  Anesthesia Plan: General   Post-op Pain Management:    Induction: Intravenous  PONV Risk Score and Plan: 3 and Midazolam, Dexamethasone, Ondansetron and Treatment may vary due to age or medical condition  Airway Management Planned: Oral ETT  Additional Equipment:   Intra-op Plan:   Post-operative Plan: Extubation in OR  Informed Consent: I have reviewed the patients History and Physical, chart, labs and discussed the procedure including the risks, benefits and alternatives for the proposed anesthesia with the patient or authorized representative who has indicated his/her  understanding and acceptance.     Dental advisory given  Plan Discussed with: CRNA  Anesthesia Plan Comments: (Plan Full PPE use Plan GETA D/W PT -WTP with same after Q&A)        Anesthesia Quick Evaluation

## 2019-03-08 NOTE — Progress Notes (Signed)
Pt was given 2 albuterol nebulizer treatments for inspiratory and expiratory wheezing per Dr. Hilaria Ota. Pt's breath sounds were clear bilaterally but diminished in the lower bases after treatment.

## 2019-03-08 NOTE — Progress Notes (Signed)
[facility]  Notified friend, Ezequiel Ganser that procedure completed. Patient's appendix had obliterated itself from the cecum, and her small intestine still was injected and dilated. Warned against ileus formation, and reasons to back to the hospital if not tolerating fluids or any signs of fever, chill, infection.   Curlene Labrum, MD [facility] [address] [place], [place] [address] [phone] (office)

## 2019-03-08 NOTE — Interval H&P Note (Signed)
History and Physical Interval Note:  03/08/2019 8:42 AM  Amanda Wilcox  has presented today for surgery, with the diagnosis of perferated appendicitis.  The various methods of treatment have been discussed with the patient and family. After consideration of risks, benefits and other options for treatment, the patient has consented to  Procedure(s): APPENDECTOMY LAPAROSCOPIC (Right) as a surgical intervention.  The patient's history has been reviewed, patient examined, no change in status, stable for surgery.  I have reviewed the patient's chart and labs.  Questions were answered to the patient's satisfaction.    No questions or changes.   Virl Cagey

## 2019-03-08 NOTE — Anesthesia Postprocedure Evaluation (Signed)
Anesthesia Post Note  Patient: Amanda Wilcox  Procedure(s) Performed: APPENDECTOMY LAPAROSCOPIC (Right Abdomen)  Patient location during evaluation: PACU Anesthesia Type: General Level of consciousness: awake, patient cooperative and lethargic Pain management: pain level controlled Vital Signs Assessment: post-procedure vital signs reviewed and stable Respiratory status: spontaneous breathing, respiratory function stable and non-rebreather facemask Cardiovascular status: stable Postop Assessment: no apparent nausea or vomiting Anesthetic complications: no Comments: Expiratory wheezing heard in PACU, orders for nebulizer treatment ordered by Dr. Hilaria Ota, given by RN.     Last Vitals:  Vitals:   03/08/19 0713  BP: 125/87  Resp: 20  Temp: 36.9 C  SpO2: 97%    Last Pain:  Vitals:   03/08/19 0713  TempSrc: Oral  PainSc: 0-No pain                 Pearson Picou

## 2019-03-08 NOTE — Op Note (Signed)
[facility]  Date of Surgery: 03/08/2019  Admit Date: 03/08/2019   Performing Service: General  Surgeon(s) and Role:    * Virl Cagey, MD - Primary   Pre-operative Diagnosis: History of Acute Appendicitis with perforation  Post-operative Diagnosis: Same   Procedure Performed: Interval Laparoscopic Appendectomy   Surgeon: Lanell Matar. Constance Haw, MD   Assistant: No qualified resident was available.   Anesthesia: General   Findings:  The appendix was found to be obliterated from the cecum with no evidence of mucosal lumen connecting the two areas. There were signs of necrosis. There was evidence of prior perforation with inflammation and scarring perforation. There was not abscess formation.   Estimated Blood Loss: Minimal   Specimens:  ID Type Source Tests Collected by Time Destination  1 : appendix Tissue PATH Appendix SURGICAL PATHOLOGY Virl Cagey, MD 03/08/2019 1027   2 : cecum Tissue PATH GI benign resection SURGICAL PATHOLOGY Virl Cagey, MD [id number]      Complications: None; patient tolerated the procedure well.   Disposition: PACU - hemodynamically stable.   Condition: stable   Indications: The patient presented with perforated appendicitis that did not require drainage 01/04/2019. She was admitted to the hospital for IV antibiotics, and was discharged home with an oral course. She did fair after her admission and had improvement in her pain. She underwent a colonoscopy that demonstrated only minor polyps but nothing concerning.  We discussed the option of interval appendectomy to prevent recurrence and to rule out any other cause such as a rare cancer. We discussed this option and the risk of bleeding, infection, injury to other organs, and the patient opted to proceed.    Procedure Details  Prior to the procedure, the risks, benefits, complications, treatment options, and expected outcomes were discussed with the patient  and/or family, including but not limited to the risk of bleeding, infection, finding of a normal appendix, and the need for conversion to an open procedure. There was concurrence with the proposed plan and informed consent was obtained. The patient was taken to the operating room, identified as Canary Brim and the procedure verified as Laproscopic Appendectomy.   The patient was placed in the supine position and general anesthesia was induced, along with placement of orogastric tube, SCD's, and a Foley catheter. The abdomen was prepped and draped in a sterile fashion. The abdomen was entered with Veress technique after an infra umbilical incision was made. A towel clip was used to elevate the abdominal wall on entry. Intraperitoneal placement was confirmed with saline drop, low entry pressures, and easy insufflation. A 10 mm optiview trocar was placed under direct visualization with a 0 degree scope in the infraumbilical position. The 10 mm 0 degree scope was placed in the abdomen and no evidence of injury was identified. A 12 mm port was placed in the left lower quadrant of the abdomen after skin incision with trocar placement under direct vision. A careful evaluation of the entire abdomen was carried out. An additional 5 mm port was placed in the suprapubic area under direct vision.  The patient was placed in Trendelenburg and left lateral decubitus position. There were omental adhesions to the anterior peritoneal cavity and down into the pelvis. The cecum was floppy and redundant, and the terminal ileum and appendix were in the pelvis with adhesions from the prior perforation.  As much of the small intestine as I could were retracted in the cephalad and left lateral direction away from the  pelvis and right lower quadrant.  There was a loop of small bowel adherent to the uretus and the terminal ileum was adherent in the pelvis, and I could not fully mobilize these two portions of bowel.  They appeared a  little injected from the prior perforation. There were no signs of abscess or purulence. An additional 5 mm trocar was placed under direct visualization in the right lower quadrant. My assistant helped me by holding back the small bowel.  I was able to free up the lateral attachments to the cecum and the side wall using electrocautery with the [place]/ Hook ligasure device.  With great care the cecum was able to be retracted back, and I carefully dissected the appendix from the pelvis.  Once the appendix was out of the pelvis, I had my assistant elevate it from the right lower quadrant port, and I ligated the mesoappendix with the [place]/ Hook Ligasure.  The appendix was noted to not have a clear base and appeared to be obliterated prior to the cecum.  I carefully completed the mesoappendix ligation with the energy device, and confirmed this suspicion.  The appendix appeared to be only attached to the cecum with thin wispy tissue.  To be safe, I came across the cecum in the area where the appendix should have ended with a standard load Endo GIA stapler.  The appendix actually separate from this portion of cecum, and was placed in the Endocatch bag to prevent [place] from losing it.  I completed the resection of the cecum with an additional standard load EndoGIA stapler.  A second Endocatch bag was used to hold the cecum specimen. Both bags were removed.  The two specimen were sent separate with a note to pathology.  I investigated the area carefully and noted no signs of a lumen or mucosa. The staple line was intact and hemostatic.  The appendix specimen was investigated and the end where the base was had no signs of a lumen connecting it to the cecum.  There was small possible mucosa tissue on the cecal staple line.  I changed outer gloves, and looked back again, and there was no evidence of bleeding, leakage, or complication when I looked back at the staple line.   Any remaining blood was suctioned out from the  abdomen, hemostasis was confirmed.   The the 12 mm site was tangential and deep, and was not closed. The 10 mm port site was closed with a 0 Vicryl suture as was the patient's prior small umbilical hernia that was noted on exam. The trocar site skin wounds were closed using subcuticular 4-0 Monocryl suture and dermabond. The patient was then awakened from general anesthesia, extubated, and taken to PACU for recovery.   Instrument, sponge, and needle counts were correct at the conclusion of the case.   Curlene Labrum, MD [facility] [address] [place], [place] [address] [phone] (office)

## 2019-03-08 NOTE — Transfer of Care (Signed)
Immediate Anesthesia Transfer of Care Note  Patient: Amanda Wilcox  Procedure(s) Performed: APPENDECTOMY LAPAROSCOPIC (Right Abdomen)  Patient Location: PACU  Anesthesia Type:General  Level of Consciousness: awake, alert , drowsy and patient cooperative  Airway & Oxygen Therapy: Patient Spontanous Breathing and Patient connected to face mask oxygen  Post-op Assessment: Report given to RN, Post -op Vital signs reviewed and stable and Patient moving all extremities X 4  Post vital signs: Reviewed and stable  Last Vitals:  Vitals Value Taken Time  BP 138/94 03/08/19 1115  Temp    Pulse 82 03/08/19 1118  Resp 14 03/08/19 1118  SpO2 97 % 03/08/19 1118  Vitals shown include unvalidated device data.  Last Pain:  Vitals:   03/08/19 0713  TempSrc: Oral  PainSc: 0-No pain      Patients Stated Pain Goal: 7 ([id number])  Complications: No apparent anesthesia complications

## 2019-03-08 NOTE — H&P (Signed)
[facility] History and Physical      Chief Complaint    Abdominal Pain      Amanda Wilcox is a 58 y.o. female.  HPI: Amanda Wilcox is a very nice 58 yo who has a history of perforated appendicitis treated with antibiotics. She has completed her course of antibiotics, and says she is doing well. She had presented to the hospital 01/04/19 with symptoms, and was discharged on 01/07/2019. She never had a collection that formed that required drainage. The patient says she is eating and drinking and having regular Bms.  She is scheduled to have her first colonoscopy with Dr. Laural Golden 02/24/2019. She denies any abdominal pain or issues with nausea/vomiting. She is worried about having another episode of appendicitis.      Past Medical History:  Diagnosis Date  . Anxiety    Trying to quit smoking - has cut back.  . Arthralgia   . Fatigue   . Neuromuscular disorder (HCC)    fibromyalgia  . Restless leg syndrome          Past Surgical History:  Procedure Laterality Date  . CARPAL TUNNEL RELEASE Right          Family History  Problem Relation Age of Onset  . Stroke Mother   . Epilepsy Maternal Aunt   . Multiple sclerosis Maternal Uncle   . Heart disease Maternal Grandfather     Social History        Tobacco Use  . Smoking status: Former Smoker    Packs/day: 0.50    Years: 40.00    Pack years: 20.00    Types: Cigarettes  . Smokeless tobacco: Never Used  Substance Use Topics  . Alcohol use: No    Alcohol/week: 0.0 standard drinks  . Drug use: No    Medications: I have reviewed the patient's current medications. Allergies as of 02/14/2019   No Known Allergies        Medication List       Accurate as of February 14, 2019 11:08 AM. If you have any questions, ask your nurse or doctor.        cholecalciferol 25 MCG (1000 UT) tablet Commonly known as: VITAMIN D3 Take 1,000 Units by mouth daily.    docusate sodium 100 MG capsule Commonly known as: COLACE Take 1 capsule (100 mg total) by mouth 2 (two) times daily.   DULoxetine 60 MG capsule Commonly known as: CYMBALTA Take 60 mg by mouth 2 (two) times a day.   gabapentin 300 MG capsule Commonly known as: NEURONTIN Take 300 mg by mouth 2 (two) times a day.   HYDROcodone-acetaminophen 5-325 MG tablet Commonly known as: NORCO/VICODIN Take 1 tablet by mouth daily as needed for pain.   meloxicam 15 MG tablet Commonly known as: MOBIC Take 15 mg by mouth daily.   modafinil 200 MG tablet Commonly known as: PROVIGIL Take 200 mg by mouth daily.   ondansetron 4 MG disintegrating tablet Commonly known as: ZOFRAN-ODT Take 1 tablet (4 mg total) by mouth every 6 (six) hours as needed for nausea.   oxybutynin 5 MG 24 hr tablet Commonly known as: DITROPAN-XL Take 5 mg by mouth daily.   varenicline 1 MG tablet Commonly known as: CHANTIX Take 1 mg by mouth as needed for smoking cessation.        ROS:  A comprehensive review of systems was negative except for: Constitutional: positive for anxious about recurrent disease  Blood pressure 125/75, pulse 83, temperature (!)  96.8 F (36 C), temperature source Tympanic, resp. rate 16, height 5\' 4"  (1.626 m), weight 196 lb (88.9 kg), SpO2 97 %. Physical Exam Vitals signs reviewed.  Constitutional:      Appearance: She is well-developed.  HENT:     Head: Normocephalic and atraumatic.  Cardiovascular:     Rate and Rhythm: Normal rate and regular rhythm.  Pulmonary:     Effort: Pulmonary effort is normal.     Breath sounds: Normal breath sounds.  Abdominal:     General: Abdomen is flat. There is no distension.     Tenderness: There is no abdominal tenderness. There is no guarding or rebound.     Hernia: No hernia is present.  Musculoskeletal: Normal range of motion.  Skin:    General: Skin is warm and dry.  Neurological:     General: No focal deficit present.      Mental Status: She is alert and oriented to person, place, and time.     Results: CT a/p 8/11 reviewed significantly inflamed and enlarged appendix with microperforation of air around it at the tip, going centrally down into the pelvis, thickened base and appendicolith  Assessment & Plan:  Amanda Wilcox is a 58 y.o. female with perforated appendicitis 01/03/19 that is now resolved and she is feeling better. She has never had a colonoscopy and is getting that 02/24/2019.  She is otherwise well but anxious about having another episode.  She is on disability now per her report.    -OR for interval laparoscopic appendectomy after colonoscopy, we discussed that some people are not doing interval appendectomies but given the risk of possible cancer and also her anxiety about a recurrence, I think that it is appropriate. We discussed the risk of bleeding, infection, injury to another organ, and need for an open surgery. She wants to proceed.   All questions were answered to the satisfaction of the patient.  Curlene Labrum, MD [facility] [address] [place], [place] [address] [phone] (office)

## 2019-03-09 ENCOUNTER — Encounter (HOSPITAL_COMMUNITY): Payer: Self-pay | Admitting: General Surgery

## 2019-03-09 LAB — SURGICAL PATHOLOGY

## 2019-03-09 NOTE — Addendum Note (Signed)
Addendum  created 03/09/19 0757 by Mickel Baas, CRNA   Charge Capture section accepted

## 2019-03-21 ENCOUNTER — Encounter (INDEPENDENT_AMBULATORY_CARE_PROVIDER_SITE_OTHER): Payer: Self-pay | Admitting: *Deleted

## 2019-03-30 ENCOUNTER — Other Ambulatory Visit: Payer: Self-pay | Admitting: Nurse Practitioner

## 2019-03-30 ENCOUNTER — Telehealth (INDEPENDENT_AMBULATORY_CARE_PROVIDER_SITE_OTHER): Payer: Self-pay | Admitting: *Deleted

## 2019-03-30 DIAGNOSIS — K8689 Other specified diseases of pancreas: Secondary | ICD-10-CM

## 2019-03-30 NOTE — Telephone Encounter (Signed)
MRCP orders entered thx

## 2019-03-30 NOTE — Telephone Encounter (Signed)
You had me put patient on recall for November for MRCP (dilated pancreatic duct ), - she has called to schedule - can you put order in, thanks

## 2019-04-05 ENCOUNTER — Other Ambulatory Visit (INDEPENDENT_AMBULATORY_CARE_PROVIDER_SITE_OTHER): Payer: Self-pay | Admitting: Nurse Practitioner

## 2019-04-05 DIAGNOSIS — K8689 Other specified diseases of pancreas: Secondary | ICD-10-CM

## 2019-04-05 NOTE — Telephone Encounter (Signed)
MRCP sch'd 04/02/19 at 8 am (730), npo after midnight

## 2019-04-12 ENCOUNTER — Other Ambulatory Visit: Payer: Self-pay

## 2019-04-12 ENCOUNTER — Other Ambulatory Visit (INDEPENDENT_AMBULATORY_CARE_PROVIDER_SITE_OTHER): Payer: Self-pay | Admitting: Nurse Practitioner

## 2019-04-12 ENCOUNTER — Ambulatory Visit (HOSPITAL_COMMUNITY)
Admission: RE | Admit: 2019-04-12 | Discharge: 2019-04-12 | Disposition: A | Discharge status: Home / Self Care | Payer: BC Managed Care – PPO | Source: Ambulatory Visit | Attending: Nurse Practitioner | Admitting: Nurse Practitioner

## 2019-04-12 DIAGNOSIS — K8689 Other specified diseases of pancreas: Secondary | ICD-10-CM

## 2019-04-14 ENCOUNTER — Encounter (INDEPENDENT_AMBULATORY_CARE_PROVIDER_SITE_OTHER): Payer: Self-pay

## 2019-04-17 ENCOUNTER — Telehealth (INDEPENDENT_AMBULATORY_CARE_PROVIDER_SITE_OTHER): Payer: Self-pay | Admitting: *Deleted

## 2019-04-17 NOTE — Telephone Encounter (Signed)
Tammy, pls call pt and let her know that I forwarded the MRI results to Dr .Laural Golden for his input, await his response. Please let her know the MRI showed a small cyst to the head of the pancreas. I would recommend a repeat abdominal MRI in 6 months but I am waiting to hear back from Dr. Laural Golden. thx

## 2019-04-17 NOTE — Telephone Encounter (Signed)
Do you have results back yet Thank you  [phone] cell [phone] home   This is from the patient , she had left this in patient advice.

## 2019-04-18 NOTE — Telephone Encounter (Signed)
Patient was called and made aware. 

## 2019-08-17 ENCOUNTER — Other Ambulatory Visit: Payer: Self-pay

## 2019-08-17 ENCOUNTER — Ambulatory Visit (INDEPENDENT_AMBULATORY_CARE_PROVIDER_SITE_OTHER): Payer: BC Managed Care – PPO | Admitting: Family Medicine

## 2019-08-17 ENCOUNTER — Encounter: Payer: Self-pay | Admitting: Family Medicine

## 2019-08-17 VITALS — BP 140/84 | HR 85 | Temp 98.6°F | Resp 16 | Ht 64.0 in | Wt 209.0 lb

## 2019-08-17 DIAGNOSIS — E559 Vitamin D deficiency, unspecified: Secondary | ICD-10-CM | POA: Insufficient documentation

## 2019-08-17 DIAGNOSIS — Z720 Tobacco use: Secondary | ICD-10-CM

## 2019-08-17 DIAGNOSIS — M797 Fibromyalgia: Secondary | ICD-10-CM | POA: Diagnosis not present

## 2019-08-17 DIAGNOSIS — Z124 Encounter for screening for malignant neoplasm of cervix: Secondary | ICD-10-CM

## 2019-08-17 DIAGNOSIS — R21 Rash and other nonspecific skin eruption: Secondary | ICD-10-CM | POA: Insufficient documentation

## 2019-08-17 DIAGNOSIS — I10 Essential (primary) hypertension: Secondary | ICD-10-CM | POA: Insufficient documentation

## 2019-08-17 DIAGNOSIS — E669 Obesity, unspecified: Secondary | ICD-10-CM

## 2019-08-17 DIAGNOSIS — Z0001 Encounter for general adult medical examination with abnormal findings: Secondary | ICD-10-CM | POA: Diagnosis not present

## 2019-08-17 DIAGNOSIS — E785 Hyperlipidemia, unspecified: Secondary | ICD-10-CM | POA: Insufficient documentation

## 2019-08-17 MED ORDER — PREDNISONE 10 MG (21) PO TBPK: ORAL_TABLET | ORAL | 0 refills | Status: DC

## 2019-08-17 MED ORDER — LISINOPRIL 10 MG PO TABS: 10.0000 mg | ORAL_TABLET | Freq: Every day | ORAL | 1 refills | Status: DC

## 2019-08-17 NOTE — Assessment & Plan Note (Signed)
History of vitamin D deficiency.  Will be getting vitamin D level drawn and treat accordingly

## 2019-08-17 NOTE — Assessment & Plan Note (Addendum)
Getting updated labs.  Encouraged heart healthy low-fat diet.

## 2019-08-17 NOTE — Assessment & Plan Note (Signed)
Asked about quitting: confirms they are currently smokes cigarettes Advise to quit smoking: Educated about QUITTING to reduce the risk of cancer, cardio and cerebrovascular disease. Assess willingness: Unwilling to quit at this time, but is working on cutting back. Assist with counseling and pharmacotherapy: Counseled for 5 minutes and literature provided. Arrange for follow up:  not quitting follow up in 3 months and continue to offer help.   

## 2019-08-17 NOTE — Assessment & Plan Note (Signed)
Amanda Wilcox is encouraged to maintain a well balanced diet that is low in salt. Start low dose lisinopril BP today. Additionally, she is also reminded that exercise is beneficial for heart health and control of  Blood pressure. 30-60 minutes daily is recommended-walking was suggested.

## 2019-08-17 NOTE — Assessment & Plan Note (Signed)
Discussed monthly self breast exams and yearly mammograms; at least 30 minutes of aerobic activity at least 5 days/week and weight-bearing exercise 2x/week; proper sunscreen use reviewed; healthy diet, including goals of calcium and vitamin D intake and alcohol recommendations (less than or equal to 1 drink/day) reviewed; regular seatbelt use; changing batteries in smoke detectors.  Immunization recommendations discussed.  Colonoscopy recommendations reviewed.  

## 2019-08-17 NOTE — Assessment & Plan Note (Signed)
Rash possibly related to anxiety.  Will try prednisone Dosepak if this does not improve.  Would consider doing referral to dermatology.

## 2019-08-17 NOTE — Assessment & Plan Note (Signed)
Is followed by Neurology. Doing well

## 2019-08-17 NOTE — Progress Notes (Signed)
Health Maintenance reviewed -   Immunization History  Administered Date(s) Administered  . Influenza,inj,Quad PF,6+ Mos 02/15/2019   Last Pap smear: Referred today Last mammogram: Needs, ordered placed, but has not gone yet  Last colonoscopy: 02/2019 Last DEXA: n/a  Dentist: needs new dentist  Ophtho: last year, new glasses Exercise: not currently, gets winded and is deconditioned some, walking daily   Other doctors caring for patient include:  Patient Care Team: Perlie Mayo, NP as PCP - General (Family Medicine)  End of Life Discussion:  Patient does not have a living will and medical power of attorney    Code Status: Prior   Subjective:   HPI  Amanda Wilcox is a 59 y.o. female who presents for annual wellness visit and follow-up on chronic medical conditions.  She has the following concerns: rash only issue    Rash: Reports it has been going on for several months now.  Reports that she started having these raised red spots on the back of the arms upper legs and some buttocks.  Reports that they do not itch but she feels a scab because she scratches with anxiety and nervousness and then she continues to do that as she wants to keep scratching at the scabs and dry skin.  Has not tried anything for it over-the-counter.  Has not had this happen in the past.  Denies having any changes in shampoo, body wash, food allergy issues, lotions or other hygiene products.  Does have some constipation at times reports taking things to help her keep regular.  Overall is doing well with this and does not believe she has any issues with it.  Eats a well-balanced diet but knows that she can do a lot better as she does ingest salt, higher fat products.  She reports that she drinks water but could do a lot better with this as well.  Reports that she is sleeping well and does not have any issues with this.  Denies having any fall or any memory changes.  Review Of Systems  Review of  Systems  Constitutional: Negative.   HENT: Negative.   Eyes: Negative.   Respiratory: Negative.   Cardiovascular: Negative.   Gastrointestinal: Negative.   Endocrine: Negative.   Genitourinary: Negative.   Musculoskeletal: Negative.   Skin: Positive for rash.  Allergic/Immunologic: Negative.   Neurological: Negative.   Hematological: Negative.   Psychiatric/Behavioral: Negative.   All other systems reviewed and are negative.   Objective:   PHYSICAL EXAM:  BP 140/84   Pulse 85   Temp 98.6 F (37 C) (Temporal)   Resp 16   Ht 5\' 4"  (1.626 m)   Wt 209 lb (94.8 kg)   SpO2 95%   BMI 35.87 kg/m   Physical Exam Vitals and nursing note reviewed.  Constitutional:      Appearance: Normal appearance. She is obese.  HENT:     Head: Normocephalic and atraumatic.     Right Ear: Hearing, tympanic membrane, ear canal and external ear normal.     Left Ear: Hearing, tympanic membrane, ear canal and external ear normal.     Nose: Nose normal.     Mouth/Throat:     Lips: Pink.     Mouth: Mucous membranes are moist.     Pharynx: Oropharynx is clear. Uvula midline.  Eyes:     General: Lids are normal. No scleral icterus.       Right eye: No discharge.  Left eye: No discharge.     Extraocular Movements: Extraocular movements intact.     Conjunctiva/sclera: Conjunctivae normal.     Pupils: Pupils are equal, round, and reactive to light.     Comments: glasses  Neck:     Thyroid: No thyroid mass, thyromegaly or thyroid tenderness.  Cardiovascular:     Rate and Rhythm: Normal rate and regular rhythm.     Pulses: Normal pulses.          Radial pulses are 2+ on the right side and 2+ on the left side.       Dorsalis pedis pulses are 2+ on the right side and 2+ on the left side.     Heart sounds: Normal heart sounds.  Pulmonary:     Effort: Pulmonary effort is normal.     Breath sounds: Normal breath sounds and air entry.  Abdominal:     General: Abdomen is flat. Bowel sounds  are normal.     Palpations: Abdomen is soft.     Tenderness: There is no right CVA tenderness or left CVA tenderness.  Musculoskeletal:        General: Normal range of motion.     Cervical back: Normal range of motion and neck supple.     Right lower leg: No edema.     Left lower leg: No edema.  Lymphadenopathy:     Cervical: No cervical adenopathy.  Skin:    General: Skin is warm and dry.     Capillary Refill: Capillary refill takes less than 2 seconds.     Findings: Rash present.     Comments: Noted to have multiple size red bumps on the back of her arm some with scabs secondary to scratching.   Neurological:     General: No focal deficit present.     Mental Status: She is alert and oriented to person, place, and time. Mental status is at baseline.     Cranial Nerves: Cranial nerves are intact.     Sensory: Sensation is intact.     Motor: Motor function is intact.     Coordination: Coordination is intact.     Gait: Gait is intact.     Deep Tendon Reflexes: Reflexes are normal and symmetric.  Psychiatric:        Attention and Perception: Attention and perception normal.        Mood and Affect: Mood and affect normal.        Speech: Speech normal.        Behavior: Behavior normal. Behavior is cooperative.        Thought Content: Thought content normal.        Cognition and Memory: Cognition and memory normal.        Judgment: Judgment normal.    Depression Screening  Depression screen [facility] 2/9 02/15/2019 01/10/2019 01/02/2019 11/17/2018  Decreased Interest 0 0 0 0  Down, Depressed, Hopeless 0 0 0 0  PHQ - 2 Score 0 0 0 0      Assessment & Plan:   1. Annual visit for general adult medical examination with abnormal findings   2. Nicotine abuse   3. Fibromyalgia   4. Obesity (BMI 35.0-39.9 without comorbidity)   5. Essential hypertension   6. Rash   7. Encounter for screening for malignant neoplasm of cervix   8. Hyperlipidemia, unspecified hyperlipidemia type   9.  Vitamin D deficiency     Tests ordered Orders Placed This Encounter  Procedures  . CBC  .  COMPLETE METABOLIC PANEL WITH GFR  . Hemoglobin A1c  . Lipid panel  . VITAMIN D 25 Hydroxy (Vit-D Deficiency, Fractures)  . TSH  . Ambulatory referral to Obstetrics / Gynecology  . Ambulatory referral to Dentistry     Plan: Please see assessment and plan per problem list above.   Meds ordered this encounter  Medications  . predniSONE (STERAPRED UNI-PAK 21 TAB) 10 MG (21) TBPK tablet    Sig: Take directed    Dispense:  21 tablet    Refill:  0    Order Specific Question:   Supervising Provider    Answer:   SIMPSON, MARGARET E [id number]  . lisinopril (ZESTRIL) 10 MG tablet    Sig: Take 1 tablet (10 mg total) by mouth daily.    Dispense:  30 tablet    Refill:  1    Order Specific Question:   Supervising Provider    Answer:   SIMPSON, MARGARET E [id number]     The patient's weight, height, BMI, and visual acuity have been recorded in the chart.  I have made referrals, counseling, and provided education to the patient based on review of the above and I have provided the patient with a written personalized care plan for preventive services.     Perlie Mayo, NP   08/17/2019

## 2019-08-17 NOTE — Assessment & Plan Note (Signed)
Declined Amanda Wilcox is educated about the importance of exercise daily to help with weight management. A minumum of 30 minutes daily is recommended. Additionally, importance of healthy food choices  with portion control discussed.   Wt Readings from Last 3 Encounters:  08/17/19 209 lb (94.8 kg)  03/06/19 196 lb (88.9 kg)  02/15/19 196 lb 0.6 oz (88.9 kg)

## 2019-08-17 NOTE — Patient Instructions (Addendum)
I appreciate the opportunity to provide you with care for your health and wellness. Today we discussed: overall health   Follow up: 6 months   Fasting labs in next week    Referrals to GYN and Dentist  Continue to work on smoking less.  Please continue to practice social distancing to keep you, your family, and our community safe.  If you must go out, please wear a mask and practice good handwashing.  It was a pleasure to see you and I look forward to continuing to work together on your health and well-being. Please do not hesitate to call the office if you need care or have questions about your care.  Have a wonderful day and week. With Gratitude, Cherly Beach, DNP, AGNP-BC  HEALTH MAINTENANCE RECOMMENDATIONS:  It is recommended that you get at least 30 minutes of aerobic exercise at least 5 days/week (for weight loss, you may need as much as 60-90 minutes). This can be any activity that gets your heart rate up. This can be divided in 10-15 minute intervals if needed, but try and build up your endurance at least once a week.  Weight bearing exercise is also recommended twice weekly.  Eat a healthy diet with lots of vegetables, fruits and fiber.  "Colorful" foods have a lot of vitamins (ie green vegetables, tomatoes, red peppers, etc).  Limit sweet tea, regular sodas and alcoholic beverages, all of which has a lot of calories and sugar.  Up to 1 alcoholic drink daily may be beneficial for women (unless trying to lose weight, watch sugars).  Drink a lot of water.  Calcium recommendations are 1200-1500 mg daily (1500 mg for postmenopausal women or women without ovaries), and vitamin D 1000 IU daily.  This should be obtained from diet and/or supplements (vitamins), and calcium should not be taken all at once, but in divided doses.  Monthly self breast exams and yearly mammograms for women over the age of 47 is recommended.  Sunscreen of at least SPF 30 should be used on all sun-exposed  parts of the skin when outside between the hours of 10 am and 4 pm (not just when at beach or pool, but even with exercise, golf, tennis, and yard work!)  Use a sunscreen that says "broad spectrum" so it covers both UVA and UVB rays, and make sure to reapply every 1-2 hours.  Remember to change the batteries in your smoke detectors when changing your clock times in the spring and fall.  Use your seat belt every time you are in a car, and please drive safely and not be distracted with cell phones and texting while driving.

## 2019-08-25 LAB — CBC
HCT: 44.8 % (ref 35.0–45.0)
Hemoglobin: 15 g/dL (ref 11.7–15.5)
MCH: 30.1 pg (ref 27.0–33.0)
MCHC: 33.5 g/dL (ref 32.0–36.0)
MCV: 90 fL (ref 80.0–100.0)
MPV: 11.5 fL (ref 7.5–12.5)
Platelets: 260 10*3/uL (ref 140–400)
RBC: 4.98 10*6/uL (ref 3.80–5.10)
RDW: 12.6 % (ref 11.0–15.0)
WBC: 8.5 10*3/uL (ref 3.8–10.8)

## 2019-08-25 LAB — COMPLETE METABOLIC PANEL WITH GFR
AG Ratio: 1.5 (calc) (ref 1.0–2.5)
ALT: 22 U/L (ref 6–29)
AST: 16 U/L (ref 10–35)
Albumin: 4.1 g/dL (ref 3.6–5.1)
Alkaline phosphatase (APISO): 89 U/L (ref 37–153)
BUN: 10 mg/dL (ref 7–25)
CO2: 29 mmol/L (ref 20–32)
Calcium: 9.6 mg/dL (ref 8.6–10.4)
Chloride: 100 mmol/L (ref 98–110)
Creat: 0.8 mg/dL (ref 0.50–1.05)
GFR, Est African American: 95 mL/min/{1.73_m2} (ref >=60)
GFR, Est Non African American: 82 mL/min/{1.73_m2} (ref >=60)
Globulin: 2.8 g/dL (calc) (ref 1.9–3.7)
Glucose, Bld: 104 mg/dL — ABNORMAL HIGH (ref 65–99)
Potassium: 4.6 mmol/L (ref 3.5–5.3)
Sodium: 137 mmol/L (ref 135–146)
Total Bilirubin: 0.3 mg/dL (ref 0.2–1.2)
Total Protein: 6.9 g/dL (ref 6.1–8.1)

## 2019-08-25 LAB — LIPID PANEL
Cholesterol: 206 mg/dL — ABNORMAL HIGH (ref <200)
HDL: 92 mg/dL (ref >=50)
LDL Cholesterol (Calc): 88 mg/dL (calc)
Non-HDL Cholesterol (Calc): 114 mg/dL (calc) (ref <130)
Total CHOL/HDL Ratio: 2.2 (calc) (ref <5.0)
Triglycerides: 165 mg/dL — ABNORMAL HIGH (ref <150)

## 2019-08-25 LAB — HEMOGLOBIN A1C
Hgb A1c MFr Bld: 5.9 % of total Hgb — ABNORMAL HIGH (ref <5.7)
Mean Plasma Glucose: 123 (calc)
eAG (mmol/L): 6.8 (calc)

## 2019-08-25 LAB — TSH: TSH: 3.63 mIU/L (ref 0.40–4.50)

## 2019-08-25 LAB — VITAMIN D 25 HYDROXY (VIT D DEFICIENCY, FRACTURES): Vit D, 25-Hydroxy: 21 ng/mL — ABNORMAL LOW (ref 30–100)

## 2019-09-19 ENCOUNTER — Ambulatory Visit (INDEPENDENT_AMBULATORY_CARE_PROVIDER_SITE_OTHER): Payer: BC Managed Care – PPO | Admitting: Adult Health

## 2019-09-19 ENCOUNTER — Other Ambulatory Visit (HOSPITAL_COMMUNITY)
Admission: RE | Admit: 2019-09-19 | Discharge: 2019-09-19 | Disposition: A | Discharge status: Home / Self Care | Payer: BC Managed Care – PPO | Source: Ambulatory Visit | Attending: Adult Health | Admitting: Adult Health

## 2019-09-19 ENCOUNTER — Encounter: Payer: Self-pay | Admitting: Adult Health

## 2019-09-19 ENCOUNTER — Other Ambulatory Visit: Payer: Self-pay

## 2019-09-19 VITALS — BP 119/71 | HR 90 | Ht 64.0 in | Wt 210.0 lb

## 2019-09-19 DIAGNOSIS — Z1212 Encounter for screening for malignant neoplasm of rectum: Secondary | ICD-10-CM

## 2019-09-19 DIAGNOSIS — Z01419 Encounter for gynecological examination (general) (routine) without abnormal findings: Secondary | ICD-10-CM | POA: Insufficient documentation

## 2019-09-19 DIAGNOSIS — Z1272 Encounter for screening for malignant neoplasm of vagina: Secondary | ICD-10-CM

## 2019-09-19 DIAGNOSIS — Z1211 Encounter for screening for malignant neoplasm of colon: Secondary | ICD-10-CM | POA: Diagnosis not present

## 2019-09-19 DIAGNOSIS — Z0000 Encounter for general adult medical examination without abnormal findings: Secondary | ICD-10-CM | POA: Insufficient documentation

## 2019-09-19 LAB — HEMOCCULT GUIAC POC 1CARD (OFFICE): Fecal Occult Blood, POC: NEGATIVE

## 2019-09-19 NOTE — Progress Notes (Signed)
Patient ID: Amanda Wilcox, female   DOB: March 08, 1961, 59 y.o.   MRN: [id number] History of Present Illness: Amanda Wilcox is a 59 year old white female, divorced, G3P3,PM in for pelvic and pap. PCP is Cherly Beach NP   Current Medications, Allergies, Past Medical History, Past Surgical History, Family History and Social History were reviewed in [employer] record.     Review of Systems: Patient denies any headaches, hearing loss, fatigue, blurred vision, shortness of breath, chest pain, abdominal pain, problems with  urination, or intercourse(not active). No joint pain or mood swings. +constipation, has body aches, has fibromyalgia    Physical Exam:BP 119/71 (BP Location: Right Arm, Patient Position: Sitting, Cuff Size: Normal)   Pulse 90   Ht 5\' 4"  (1.626 m)   Wt 210 lb (95.3 kg)   BMI 36.05 kg/m  General:  Well developed, well nourished, no acute distress Skin:  Warm and dry Lungs; Clear to auscultation bilaterally Cardiovascular: Regular rate and rhythm Pelvic:  External genitalia is normal in appearance, has scarring from old boils and hidradenitis.  The vagina is pale with loss of moisture and rugae.Marland Kitchen Urethra has no lesions or masses. The cervix is bulbous and smooth, pap with high risk HPV 16/18 genotyping performed.  Uterus is felt to be normal size, shape, and contour.  No adnexal masses or tenderness noted.Bladder is non tender, no masses felt. Rectal: Good sphincter tone, no polyps, or hemorrhoids felt.  Hemoccult negative. Extremities/musculoskeletal:  No swelling or varicosities noted, no clubbing or cyanosis Psych:  No mood changes, alert and cooperative,seems happy AA is 0 Fall risk is low PHQ 9 score is 7, no SI, is on Cymbalta and says last year has been a hard one, ex husband died, is on long term DB, trying to get SS Disability,and had appendix removed.  Examination chaperoned by Celene Squibb LPN.  Impression and Plan: 1. Encounter for  gynecological examination with Papanicolaou smear of cervix Pap sent Pap in 3 years if normal Get physical with PCP Get mammogram Labs with PCP   2. Screening for colorectal cancer Colonoscopy per GI 2025

## 2019-09-21 LAB — CYTOLOGY - PAP
Comment: NEGATIVE
Diagnosis: REACTIVE
High risk HPV: NEGATIVE

## 2019-09-27 ENCOUNTER — Ambulatory Visit (HOSPITAL_COMMUNITY)
Admission: RE | Admit: 2019-09-27 | Discharge: 2019-09-27 | Disposition: A | Discharge status: Home / Self Care | Payer: BC Managed Care – PPO | Source: Ambulatory Visit | Attending: Family Medicine | Admitting: Family Medicine

## 2019-09-27 ENCOUNTER — Ambulatory Visit (HOSPITAL_COMMUNITY): Payer: BC Managed Care – PPO

## 2019-09-27 ENCOUNTER — Other Ambulatory Visit: Payer: Self-pay

## 2019-09-27 DIAGNOSIS — Z1239 Encounter for other screening for malignant neoplasm of breast: Secondary | ICD-10-CM

## 2019-09-27 DIAGNOSIS — Z1231 Encounter for screening mammogram for malignant neoplasm of breast: Secondary | ICD-10-CM | POA: Insufficient documentation

## 2019-09-27 NOTE — Progress Notes (Signed)
LVM for pt to call the office.

## 2019-12-01 ENCOUNTER — Ambulatory Visit: Payer: BC Managed Care – PPO | Admitting: Family Medicine

## 2019-12-20 ENCOUNTER — Ambulatory Visit: Payer: BC Managed Care – PPO | Admitting: Family Medicine

## 2019-12-20 ENCOUNTER — Encounter: Payer: Self-pay | Admitting: Family Medicine

## 2019-12-20 ENCOUNTER — Other Ambulatory Visit: Payer: Self-pay

## 2019-12-20 ENCOUNTER — Telehealth (INDEPENDENT_AMBULATORY_CARE_PROVIDER_SITE_OTHER): Payer: BC Managed Care – PPO | Admitting: Family Medicine

## 2019-12-20 VITALS — BP 119/71 | Ht 64.0 in | Wt 210.0 lb

## 2019-12-20 DIAGNOSIS — R21 Rash and other nonspecific skin eruption: Secondary | ICD-10-CM

## 2019-12-20 NOTE — Patient Instructions (Addendum)
I appreciate the opportunity to provide you with care for your health and wellness. Today we discussed: on going rash   Follow up: March Annual -fasting morning appt   No labs  Referrals today- Dermatology for Rash   Please continue to work on smoking less.   Have a great rest of the year :)  Please continue to practice social distancing to keep you, your family, and our community safe.  If you must go out, please wear a mask and practice good handwashing.  It was a pleasure to see you and I look forward to continuing to work together on your health and well-being. Please do not hesitate to call the office if you need care or have questions about your care.  Have a wonderful day and week. With Gratitude, Cherly Beach, DNP, AGNP-BC

## 2019-12-20 NOTE — Progress Notes (Signed)
Virtual Visit via Telephone Note   This visit type was conducted due to national recommendations for restrictions regarding the COVID-19 Pandemic (e.g. social distancing) in an effort to limit this patient's exposure and mitigate transmission in our community.  Due to her co-morbid illnesses, this patient is at least at moderate risk for complications without adequate follow up.  This format is felt to be most appropriate for this patient at this time.  The patient did not have access to video technology/had technical difficulties with video requiring transitioning to audio format only (telephone).  All issues noted in this document were discussed and addressed.  No physical exam could be performed with this format.   Evaluation Performed:  Follow-up visit  Date:  12/20/2019   ID:  Amanda Wilcox, DOB [birth year], MRN [id number]  Patient Location: Home Provider Location: Office/Clinic  Location of Patient: Home Location of Provider: Telehealth Consent was obtain for visit to be over via telehealth. I verified that I am speaking with the correct person using two identifiers.  PCP:  Perlie Mayo, NP   Chief Complaint:  Rash   History of Present Illness:    Amanda Wilcox is a 59 y.o. female with history of hypertension, most recently in the last 4 to 5 months rash, smoker, hyperlipidemia,, vitamin D deficiency.  Among others.  Presents today for 28-month follow-up and reports that her rash did not subside completely after the prednisone Dosepak prescribed 4 months ago.  She is willing to do a referral to dermatology.  We possibly thought this related to anxiety but that has not changed or improved.  The patient does not have symptoms concerning for COVID-19 infection (fever, chills, cough, or new shortness of breath).   Past Medical, Surgical, Social History, Allergies, and Medications have been Reviewed.  Past Medical History:  Diagnosis Date  . Acute perforated appendicitis  01/04/2019  . Anxiety    Trying to quit smoking - has cut back.  . Arthralgia   . Colon cancer screening 02/02/2019  . Fatigue   . Generalized abdominal pain 01/02/2019  . Neuromuscular disorder (HCC)    fibromyalgia  . Perforated appendicitis   . Restless leg syndrome   . Shortness of breath 01/02/2019  . Sleep apnea    Past Surgical History:  Procedure Laterality Date  . CARPAL TUNNEL RELEASE Right   . COLONOSCOPY WITH PROPOFOL N/A 02/24/2019   Procedure: COLONOSCOPY WITH PROPOFOL;  Surgeon: Rogene Houston, MD;  [facility];  Service: Endoscopy;  Laterality: N/A;  . LAPAROSCOPIC APPENDECTOMY Right 03/08/2019   Procedure: APPENDECTOMY LAPAROSCOPIC;  Surgeon: Virl Cagey, MD;  Location: [facility];  Service: General;  Laterality: Right;  . POLYPECTOMY  02/24/2019   Procedure: POLYPECTOMY;  Surgeon: Rogene Houston, MD;  Location: [facility];  Service: Endoscopy;;  sigmoid and transverse     No outpatient medications have been marked as taking for the 12/20/19 encounter (Video Visit) with Perlie Mayo, NP.     Allergies:   Patient has no known allergies.   ROS:   Please see the history of present illness.    All other systems reviewed and are negative.   Labs/Other Tests and Data Reviewed:    Recent Labs: 08/24/2019: ALT 22; BUN 10; Creat 0.80; Hemoglobin 15.0; Platelets 260; Potassium 4.6; Sodium 137; TSH 3.63   Recent Lipid Panel Lab Results  Component Value Date/Time   CHOL 206 (H) 08/24/2019 08:07 AM   TRIG 165 (H) 08/24/2019  08:07 AM   HDL 92 08/24/2019 08:07 AM   CHOLHDL 2.2 08/24/2019 08:07 AM   LDLCALC 88 08/24/2019 08:07 AM    Wt Readings from Last 3 Encounters:  12/20/19 (!) 210 lb (95.3 kg)  09/19/19 210 lb (95.3 kg)  08/17/19 209 lb (94.8 kg)     Objective:    Vital Signs:  BP 119/71   Ht 5\' 4"  (1.626 m)   Wt (!) 210 lb (95.3 kg)   BMI 36.05 kg/m    VITAL SIGNS:  reviewed GEN:  Alert and oriented RESPIRATORY:  no shortness of  breath noted in conversation PSYCH:  Normal affect and mood  ASSESSMENT & PLAN:    1. Rash   Time:   Today, I have spent 10 minutes with the patient with telehealth technology discussing the above problems.     Medication Adjustments/Labs and Tests Ordered: Current medicines are reviewed at length with the patient today.  Concerns regarding medicines are outlined above.   Tests Ordered: No orders of the defined types were placed in this encounter.   Medication Changes: No orders of the defined types were placed in this encounter.   Disposition:  Follow up March for Annual  Signed, Perlie Mayo, NP  12/20/2019 3:35 PM     [facility]

## 2020-02-21 ENCOUNTER — Ambulatory Visit: Payer: BC Managed Care – PPO | Admitting: Family Medicine

## 2020-04-22 ENCOUNTER — Encounter (INDEPENDENT_AMBULATORY_CARE_PROVIDER_SITE_OTHER): Payer: Self-pay | Admitting: *Deleted

## 2020-05-01 ENCOUNTER — Telehealth (INDEPENDENT_AMBULATORY_CARE_PROVIDER_SITE_OTHER): Payer: Self-pay | Admitting: Internal Medicine

## 2020-05-01 NOTE — Telephone Encounter (Signed)
Patient left voice mail message stating it was time for her to schedule her yearly MRI - please advise - ph# [phone]

## 2020-05-07 NOTE — Telephone Encounter (Signed)
Noted, thanks!

## 2020-05-08 ENCOUNTER — Other Ambulatory Visit (INDEPENDENT_AMBULATORY_CARE_PROVIDER_SITE_OTHER): Payer: Self-pay

## 2020-05-08 ENCOUNTER — Telehealth (INDEPENDENT_AMBULATORY_CARE_PROVIDER_SITE_OTHER): Payer: Self-pay

## 2020-05-08 DIAGNOSIS — K8689 Other specified diseases of pancreas: Secondary | ICD-10-CM

## 2020-05-08 NOTE — Telephone Encounter (Signed)
LeighAnn Nili Honda, CMA  

## 2020-05-10 ENCOUNTER — Telehealth (INDEPENDENT_AMBULATORY_CARE_PROVIDER_SITE_OTHER): Payer: Self-pay

## 2020-05-10 NOTE — Telephone Encounter (Signed)
Opened in error

## 2020-05-30 ENCOUNTER — Ambulatory Visit (HOSPITAL_COMMUNITY)
Admission: RE | Admit: 2020-05-30 | Discharge: 2020-05-30 | Disposition: A | Discharge status: Home / Self Care | Payer: BC Managed Care – PPO | Source: Ambulatory Visit | Attending: Internal Medicine | Admitting: Internal Medicine

## 2020-05-30 ENCOUNTER — Other Ambulatory Visit: Payer: Self-pay

## 2020-05-30 ENCOUNTER — Other Ambulatory Visit (INDEPENDENT_AMBULATORY_CARE_PROVIDER_SITE_OTHER): Payer: Self-pay | Admitting: Internal Medicine

## 2020-05-30 DIAGNOSIS — K8689 Other specified diseases of pancreas: Secondary | ICD-10-CM | POA: Diagnosis not present

## 2020-05-30 MED ORDER — GADOBUTROL 1 MMOL/ML IV SOLN
10.0000 mL | Freq: Once | INTRAVENOUS | Status: AC | PRN
  Administered 2020-05-30: 10 mL via INTRAVENOUS

## 2020-06-30 IMAGING — CR PORTABLE CHEST - 1 VIEW
1 series · 1 of 1 positions shown · non-contrast
Comparison: None.

CLINICAL DATA: 58-year-old female with acute appendicitis.

EXAM:
PORTABLE CHEST 1 VIEW

[ap]
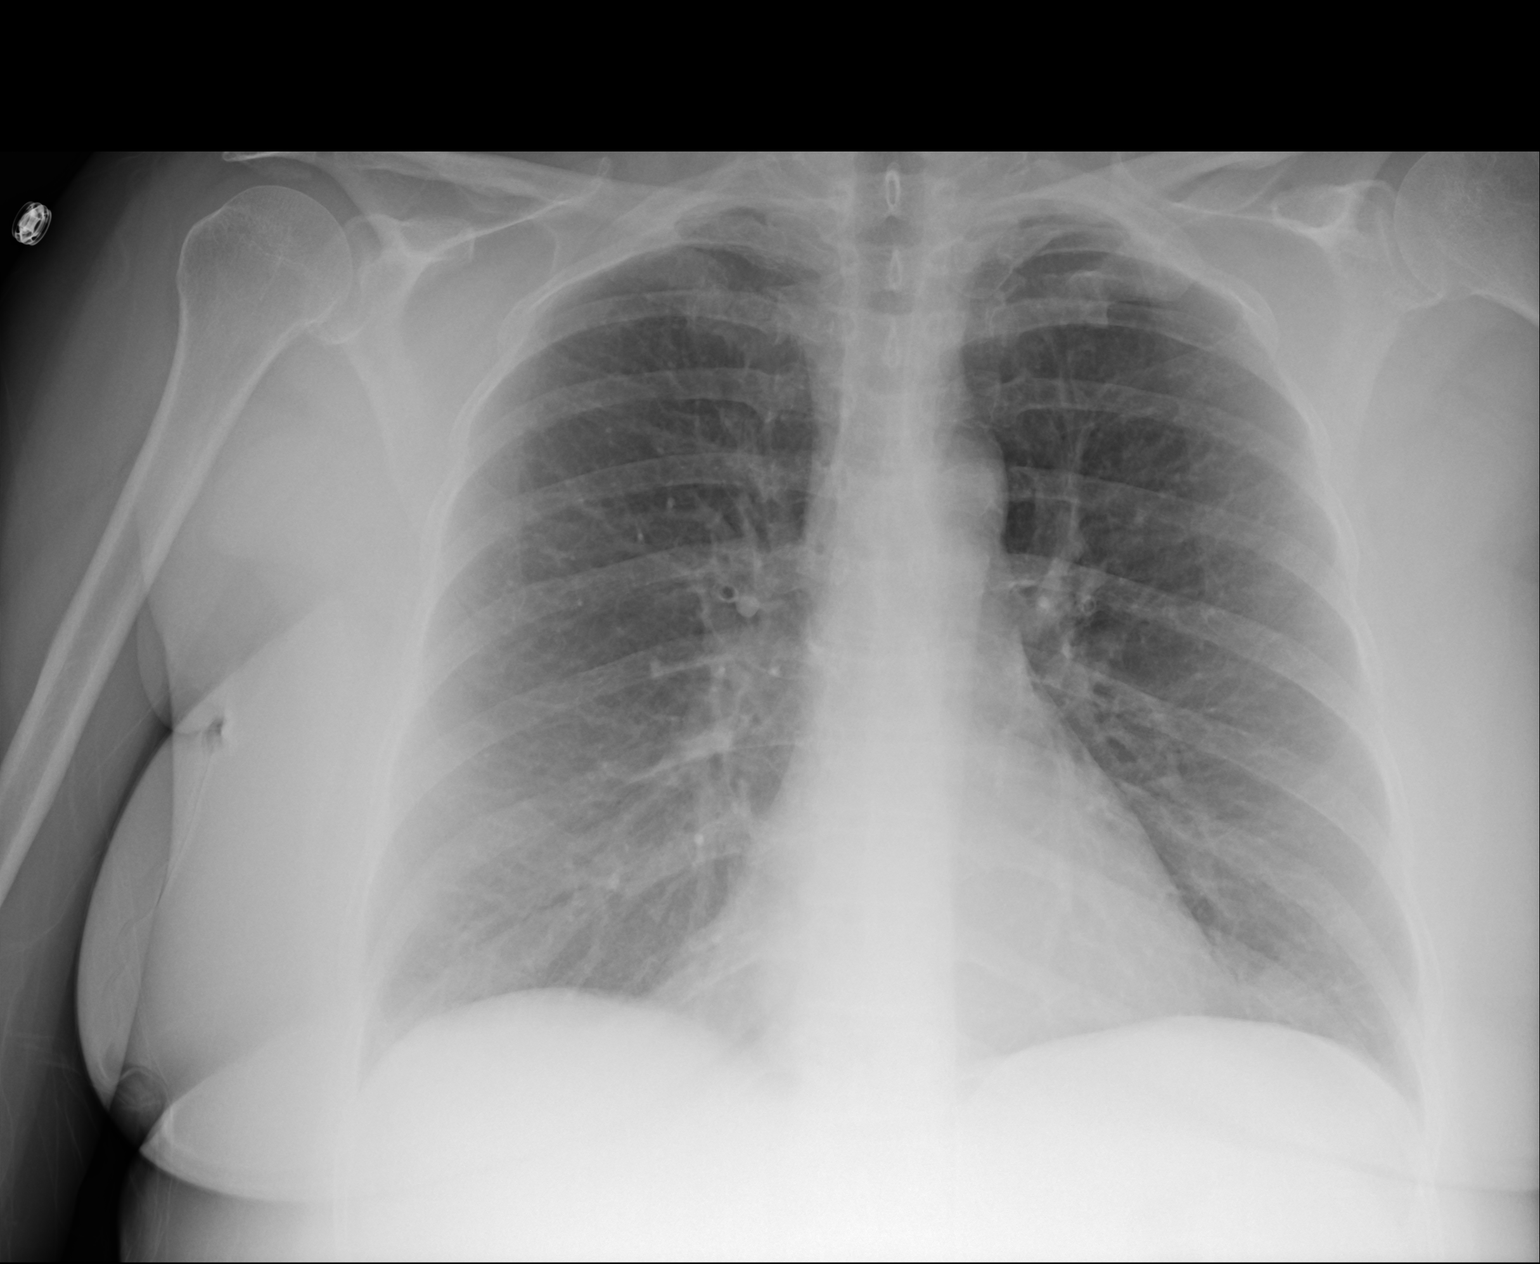

[1 of 1 positions shown; findings below may reference images not displayed]

FINDINGS: The heart size and mediastinal contours are within normal limits.
Both lungs are clear. The visualized skeletal structures are
unremarkable.
IMPRESSION: No active disease.

## 2020-08-20 ENCOUNTER — Ambulatory Visit (INDEPENDENT_AMBULATORY_CARE_PROVIDER_SITE_OTHER): Payer: BC Managed Care – PPO | Admitting: Nurse Practitioner

## 2020-08-20 ENCOUNTER — Other Ambulatory Visit: Payer: Self-pay

## 2020-08-20 ENCOUNTER — Encounter: Payer: Self-pay | Admitting: Nurse Practitioner

## 2020-08-20 VITALS — BP 132/82 | HR 80 | Temp 98.8°F | Resp 18 | Ht 64.0 in | Wt 211.0 lb

## 2020-08-20 DIAGNOSIS — M797 Fibromyalgia: Secondary | ICD-10-CM

## 2020-08-20 DIAGNOSIS — Z139 Encounter for screening, unspecified: Secondary | ICD-10-CM | POA: Diagnosis not present

## 2020-08-20 DIAGNOSIS — I10 Essential (primary) hypertension: Secondary | ICD-10-CM

## 2020-08-20 DIAGNOSIS — Z0000 Encounter for general adult medical examination without abnormal findings: Secondary | ICD-10-CM

## 2020-08-20 DIAGNOSIS — E785 Hyperlipidemia, unspecified: Secondary | ICD-10-CM | POA: Diagnosis not present

## 2020-08-20 NOTE — Assessment & Plan Note (Signed)
-  will check labs today -no statin currently

## 2020-08-20 NOTE — Progress Notes (Signed)
Established Patient Office Visit  Subjective:  Patient ID: Amanda Wilcox, female    DOB: 04/21/1961  Age: 60 y.o. MRN: [id number]  CC:  Chief Complaint  Patient presents with  . Annual Exam    HPI Amanda Wilcox presents for physical exam. She sees Dr. Florene Glen with [facility] for chronic back pain. She has hx of fibromyalgia and sees neurology for this.   Past Medical History:  Diagnosis Date  . Acute perforated appendicitis 01/04/2019  . Anxiety    Trying to quit smoking - has cut back.  . Arthralgia   . Colon cancer screening 02/02/2019  . Fatigue   . Generalized abdominal pain 01/02/2019  . Neuromuscular disorder (HCC)    fibromyalgia  . Perforated appendicitis   . Restless leg syndrome   . Shortness of breath 01/02/2019  . Sleep apnea     Past Surgical History:  Procedure Laterality Date  . CARPAL TUNNEL RELEASE Right   . COLONOSCOPY WITH PROPOFOL N/A 02/24/2019   Procedure: COLONOSCOPY WITH PROPOFOL;  Surgeon: Rogene Houston, MD;  [facility];  Service: Endoscopy;  Laterality: N/A;  . LAPAROSCOPIC APPENDECTOMY Right 03/08/2019   Procedure: APPENDECTOMY LAPAROSCOPIC;  Surgeon: Virl Cagey, MD;  Location: [facility];  Service: General;  Laterality: Right;  . POLYPECTOMY  02/24/2019   Procedure: POLYPECTOMY;  Surgeon: Rogene Houston, MD;  [facility];  Service: Endoscopy;;  sigmoid and transverse    Family History  Problem Relation Age of Onset  . Stroke Mother   . Epilepsy Maternal Aunt   . Multiple sclerosis Maternal Uncle   . Heart disease Maternal Grandfather     Social History   Socioeconomic History  . Marital status: Divorced    Spouse name: Not on file  . Number of children: 3  . Years of education: GED  . Highest education level: Not on file  Occupational History  . Occupation: Disability  Tobacco Use  . Smoking status: Current Every Day Smoker    Packs/day: 0.50    Years: 40.00    Pack years: 20.00     Types: Cigarettes  . Smokeless tobacco: Never Used  Vaping Use  . Vaping Use: Never used  Substance and Sexual Activity  . Alcohol use: No    Alcohol/week: 0.0 standard drinks  . Drug use: No  . Sexual activity: Not Currently    Birth control/protection: None, Post-menopausal  Other Topics Concern  . Not on file  Social History Narrative   Lives with a Nino Glow  (roommate)   1 dog: Tanko Water engineer mix)    Left-handed.       Daughter Zebedee Iba- 3 grandchildren    Daughter Lattie Haw (in the army)    Son Darnell Level -2 grandchildren       Enjoys spending time with best friends, game nights, cook outs, spending time with kids and grandchildren      Diet: eggs, pancakes, oatmeal, sausage, chicken, does not get a lot of veggies and fruits like should. Does eat a lot more breads, meats.    Caffeine: 2 c coffee (cream and sugar), will drink some soft drinks/but rare   Water: 4 cups daily      Wears seat belt    Wears sunscreen   Smoke and carbon monoxide detectors   Does not use phone while driving   Social Determinants of Health   Financial Resource Strain: Medium Risk  . Difficulty of Paying Living Expenses: Somewhat hard  Food  Insecurity: No Food Insecurity  . Worried About Charity fundraiser in the Last Year: Never true  . Ran Out of Food in the Last Year: Never true  Transportation Needs: No Transportation Needs  . Lack of Transportation (Medical): No  . Lack of Transportation (Non-Medical): No  Physical Activity: Insufficiently Active  . Days of Exercise per Week: 7 days  . Minutes of Exercise per Session: 20 min  Stress: Unknown  . Feeling of Stress : Patient refused  Social Connections: Socially Isolated  . Frequency of Communication with Friends and Family: More than three times a week  . Frequency of Social Gatherings with Friends and Family: More than three times a week  . Attends Religious Services: Never  . Active Member of Clubs or Organizations: No  . Attends  Archivist Meetings: Never  . Marital Status: Divorced  Human resources officer Violence: Not At Risk  . Fear of Current or Ex-Partner: No  . Emotionally Abused: No  . Physically Abused: No  . Sexually Abused: No    Outpatient Medications Prior to Visit  Medication Sig Dispense Refill  . cholecalciferol (VITAMIN D3) 25 MCG (1000 UT) tablet Take 1,000 Units by mouth daily.    Marland Kitchen docusate sodium (COLACE) 100 MG capsule Take 1 capsule (100 mg total) by mouth 2 (two) times daily. 60 capsule 2  . DULoxetine (CYMBALTA) 60 MG capsule Take 60 mg by mouth 2 (two) times a day.    . gabapentin (NEURONTIN) 300 MG capsule Take 300 mg by mouth 2 (two) times a day.    Marland Kitchen HYDROcodone-acetaminophen (NORCO/VICODIN) 5-325 MG tablet Take 1 tablet by mouth 2 (two) times daily as needed (for pain.).     Marland Kitchen lisinopril (ZESTRIL) 10 MG tablet Take 1 tablet (10 mg total) by mouth daily. 30 tablet 1  . meloxicam (MOBIC) 15 MG tablet Take 1 tablet (15 mg total) by mouth daily.    . modafinil (PROVIGIL) 200 MG tablet Take 200 mg by mouth daily.    Marland Kitchen oxybutynin (DITROPAN-XL) 5 MG 24 hr tablet Take 5 mg by mouth at bedtime.     . predniSONE (STERAPRED UNI-PAK 21 TAB) 10 MG (21) TBPK tablet Take directed (Patient not taking: Reported on 09/19/2019) 21 tablet 0   No facility-administered medications prior to visit.    No Known Allergies  ROS Review of Systems  Constitutional: Negative.   HENT: Negative.   Eyes: Negative.   Respiratory: Negative.   Cardiovascular: Negative.   Gastrointestinal: Negative.   Endocrine: Negative.   Genitourinary: Negative.   Musculoskeletal: Negative.   Skin: Negative.   Allergic/Immunologic: Negative.   Neurological: Negative for tremors, syncope, facial asymmetry, weakness, light-headedness, numbness and headaches.       Pain from fibromyalgia  Hematological: Negative.   Psychiatric/Behavioral: Negative.       Objective:    Physical Exam Constitutional:       Appearance: She is obese.  HENT:     Head: Normocephalic and atraumatic.     Right Ear: Tympanic membrane, ear canal and external ear normal.     Left Ear: Tympanic membrane, ear canal and external ear normal.     Nose: Nose normal.     Mouth/Throat:     Mouth: Mucous membranes are moist.     Pharynx: Oropharynx is clear.  Eyes:     Extraocular Movements: Extraocular movements intact.     Conjunctiva/sclera: Conjunctivae normal.     Pupils: Pupils are equal, round, and reactive to  light.  Cardiovascular:     Rate and Rhythm: Normal rate and regular rhythm.     Pulses: Normal pulses.     Heart sounds: Normal heart sounds.  Pulmonary:     Effort: Pulmonary effort is normal.     Breath sounds: Normal breath sounds.  Abdominal:     General: Abdomen is flat. Bowel sounds are normal.     Palpations: Abdomen is soft.  Musculoskeletal:        General: Normal range of motion.     Cervical back: Normal range of motion and neck supple.  Skin:    General: Skin is warm and dry.     Capillary Refill: Capillary refill takes less than 2 seconds.  Neurological:     General: No focal deficit present.     Mental Status: She is alert and oriented to person, place, and time.     Cranial Nerves: No cranial nerve deficit.     Motor: No weakness.     Coordination: Coordination normal.  Psychiatric:        Mood and Affect: Mood normal.        Thought Content: Thought content normal.        Judgment: Judgment normal.     BP 132/82   Pulse 80   Temp 98.8 F (37.1 C)   Resp 18   Ht 5' 4"  (1.626 m)   Wt 211 lb (95.7 kg)   BMI 36.22 kg/m  Wt Readings from Last 3 Encounters:  08/20/20 211 lb (95.7 kg)  12/20/19 (!) 210 lb (95.3 kg)  09/19/19 210 lb (95.3 kg)     Health Maintenance Due  Topic Date Due  . Hepatitis C Screening  Never done  . COVID-19 Vaccine (1) Never done    There are no preventive care reminders to display for this patient.  Lab Results  Component Value Date    TSH 3.63 08/24/2019   Lab Results  Component Value Date   WBC 8.5 08/24/2019   HGB 15.0 08/24/2019   HCT 44.8 08/24/2019   MCV 90.0 08/24/2019   PLT 260 08/24/2019   Lab Results  Component Value Date   NA 137 08/24/2019   K 4.6 08/24/2019   CO2 29 08/24/2019   GLUCOSE 104 (H) 08/24/2019   BUN 10 08/24/2019   CREATININE 0.80 08/24/2019   BILITOT 0.3 08/24/2019   ALKPHOS 80 01/03/2019   AST 16 08/24/2019   ALT 22 08/24/2019   PROT 6.9 08/24/2019   ALBUMIN 3.7 01/03/2019   CALCIUM 9.6 08/24/2019   ANIONGAP 11 01/07/2019   Lab Results  Component Value Date   CHOL 206 (H) 08/24/2019   Lab Results  Component Value Date   HDL 92 08/24/2019   Lab Results  Component Value Date   LDLCALC 88 08/24/2019   Lab Results  Component Value Date   TRIG 165 (H) 08/24/2019   Lab Results  Component Value Date   CHOLHDL 2.2 08/24/2019   Lab Results  Component Value Date   HGBA1C 5.9 (H) 08/24/2019      Assessment & Plan:   Problem List Items Addressed This Visit      Cardiovascular and Mediastinum   Essential hypertension    BP Readings from Last 3 Encounters:  08/20/20 (!) 152/87  12/20/19 119/71  09/19/19 119/71  -132/82 when taken manually -takes lisinopril 10 mg daily      Relevant Orders   CBC with Differential/Platelet   CMP14+EGFR     Other  Fibromyalgia    -takes cymbalta 60 mg BID      Hyperlipidemia    -will check labs today -no statin currently      Relevant Orders   Lipid Panel With LDL/HDL Ratio   Routine medical exam    -physical exam completed today      Relevant Orders   CBC with Differential/Platelet   CMP14+EGFR   HCV Ab w/Rflx to Verification   Lipid Panel With LDL/HDL Ratio    Other Visit Diagnoses    Screening due    -  Primary   Relevant Orders   HCV Ab w/Rflx to Verification      No orders of the defined types were placed in this encounter.   Follow-up: Return in about 1 year (around 08/20/2021) for Physical Exam.     Noreene Larsson, NP

## 2020-08-20 NOTE — Assessment & Plan Note (Signed)
-  takes cymbalta 60 mg BID

## 2020-08-20 NOTE — Assessment & Plan Note (Signed)
-  physical exam completed today

## 2020-08-20 NOTE — Assessment & Plan Note (Addendum)
BP Readings from Last 3 Encounters:  08/20/20 (!) 152/87  12/20/19 119/71  09/19/19 119/71  -132/82 when taken manually -takes lisinopril 10 mg daily

## 2020-08-21 LAB — CBC WITH DIFFERENTIAL/PLATELET
Basophils Absolute: 0 10*3/uL (ref 0.0–0.2)
Basos: 0 %
EOS (ABSOLUTE): 0.1 10*3/uL (ref 0.0–0.4)
Eos: 2 %
Hematocrit: 44.9 % (ref 34.0–46.6)
Hemoglobin: 14.8 g/dL (ref 11.1–15.9)
Immature Grans (Abs): 0 10*3/uL (ref 0.0–0.1)
Immature Granulocytes: 0 %
Lymphocytes Absolute: 1.8 10*3/uL (ref 0.7–3.1)
Lymphs: 26 %
MCH: 29.9 pg (ref 26.6–33.0)
MCHC: 33 g/dL (ref 31.5–35.7)
MCV: 91 fL (ref 79–97)
Monocytes Absolute: 0.5 10*3/uL (ref 0.1–0.9)
Monocytes: 7 %
Neutrophils Absolute: 4.3 10*3/uL (ref 1.4–7.0)
Neutrophils: 65 %
Platelets: 229 10*3/uL (ref 150–450)
RBC: 4.95 x10E6/uL (ref 3.77–5.28)
RDW: 12.8 % (ref 11.7–15.4)
WBC: 6.6 10*3/uL (ref 3.4–10.8)

## 2020-08-21 LAB — CMP14+EGFR
ALT: 22 IU/L (ref 0–32)
AST: 24 IU/L (ref 0–40)
Albumin/Globulin Ratio: 1.5 (ref 1.2–2.2)
Albumin: 4.3 g/dL (ref 3.8–4.9)
Alkaline Phosphatase: 107 IU/L (ref 44–121)
BUN/Creatinine Ratio: 11 (ref 9–23)
BUN: 9 mg/dL (ref 6–24)
Bilirubin Total: <0.2 mg/dL (ref 0.0–1.2)
CO2: 20 mmol/L (ref 20–29)
Calcium: 9.4 mg/dL (ref 8.7–10.2)
Chloride: 102 mmol/L (ref 96–106)
Creatinine, Ser: 0.79 mg/dL (ref 0.57–1.00)
Globulin, Total: 2.8 g/dL (ref 1.5–4.5)
Glucose: 103 mg/dL — ABNORMAL HIGH (ref 65–99)
Potassium: 5 mmol/L (ref 3.5–5.2)
Sodium: 142 mmol/L (ref 134–144)
Total Protein: 7.1 g/dL (ref 6.0–8.5)
eGFR: 87 mL/min/{1.73_m2} (ref >59)

## 2020-08-21 LAB — LIPID PANEL WITH LDL/HDL RATIO
Cholesterol, Total: 213 mg/dL — ABNORMAL HIGH (ref 100–199)
HDL: 61 mg/dL (ref >39)
LDL Chol Calc (NIH): 119 mg/dL — ABNORMAL HIGH (ref 0–99)
LDL/HDL Ratio: 2 ratio (ref 0.0–3.2)
Triglycerides: 188 mg/dL — ABNORMAL HIGH (ref 0–149)
VLDL Cholesterol Cal: 33 mg/dL (ref 5–40)

## 2020-08-21 LAB — HCV AB W/RFLX TO VERIFICATION: HCV Ab: <0.1 s/co ratio (ref 0.0–0.9)

## 2020-08-21 LAB — HCV INTERPRETATION

## 2020-08-21 NOTE — Progress Notes (Signed)
Lipids are slightly elevated and blood sugar was 3 points high. Try cutting back on fried/fatty food and getting some exercise to get your numbers better. We will monitor these with routine labs. No need to add or change medicines.

## 2020-09-13 DIAGNOSIS — G4714 Hypersomnia due to medical condition: Secondary | ICD-10-CM | POA: Diagnosis not present

## 2020-09-13 DIAGNOSIS — G4733 Obstructive sleep apnea (adult) (pediatric): Secondary | ICD-10-CM | POA: Diagnosis not present

## 2020-09-13 DIAGNOSIS — M545 Low back pain, unspecified: Secondary | ICD-10-CM | POA: Diagnosis not present

## 2020-09-13 DIAGNOSIS — L209 Atopic dermatitis, unspecified: Secondary | ICD-10-CM | POA: Diagnosis not present

## 2020-09-13 DIAGNOSIS — G5603 Carpal tunnel syndrome, bilateral upper limbs: Secondary | ICD-10-CM | POA: Diagnosis not present

## 2020-09-13 DIAGNOSIS — R21 Rash and other nonspecific skin eruption: Secondary | ICD-10-CM | POA: Diagnosis not present

## 2020-09-13 DIAGNOSIS — M542 Cervicalgia: Secondary | ICD-10-CM | POA: Diagnosis not present

## 2020-09-13 DIAGNOSIS — R69 Illness, unspecified: Secondary | ICD-10-CM | POA: Diagnosis not present

## 2020-09-13 DIAGNOSIS — Z79891 Long term (current) use of opiate analgesic: Secondary | ICD-10-CM | POA: Diagnosis not present

## 2020-09-13 DIAGNOSIS — M797 Fibromyalgia: Secondary | ICD-10-CM | POA: Diagnosis not present

## 2020-10-06 IMAGING — MR MR MRCP
6 of 10 series · 21 of 48 positions shown · non-contrast
Comparison: CT 01/03/2019

CLINICAL DATA: Pancreatic duct dilatation is noted on CT exam. MRCP
recommended.

EXAM:
MRI ABDOMEN WITHOUT CONTRAST  (INCLUDING MRCP)
TECHNIQUE: Multiplanar multisequence MR imaging of the abdomen was performed.
Heavily T2-weighted images of the biliary and pancreatic ducts were
obtained, and three-dimensional MRCP images were rendered by post
processing.

[Series 3: T2 · axial · 4.0mm · 1.16mm/px · z∈[-118,+108]mm · 3 of 48 slices shown]
[im 1/48]
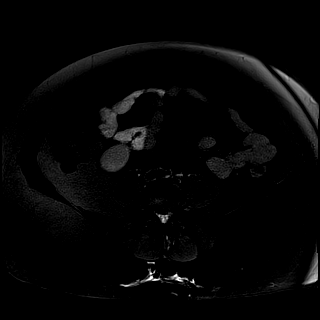
[im 24/48]
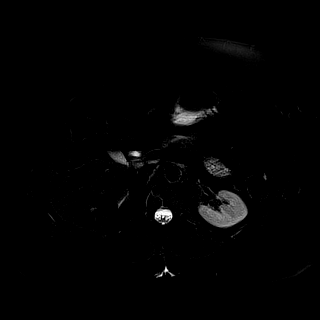
[im 48/48]
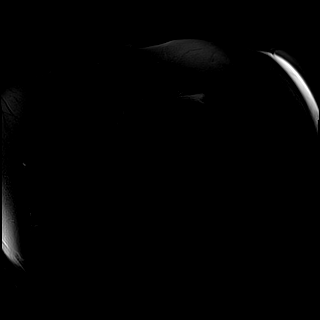

[Series 5: cor thins · coronal · 4.0mm · 0.78mm/px · 2 of 20 slices shown]
[im 1/20]
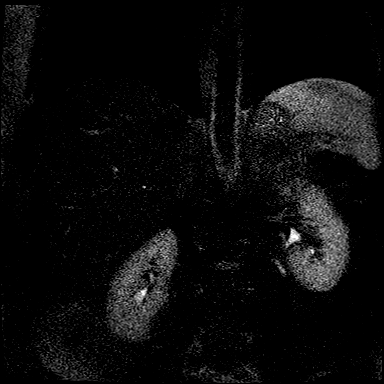
[im 20/20]
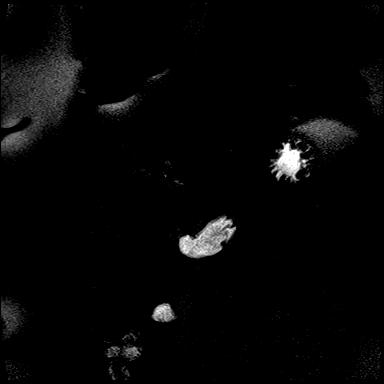

[Series 6: ax dual echo_in · axial · 4.0mm · 0.55mm/px · z∈[-134,+118]mm · 6 of 64 slices shown]
[im 1/64]
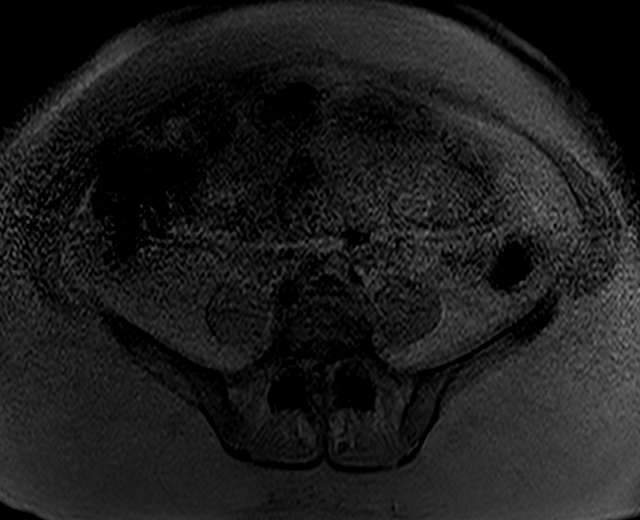
[im 13/64]
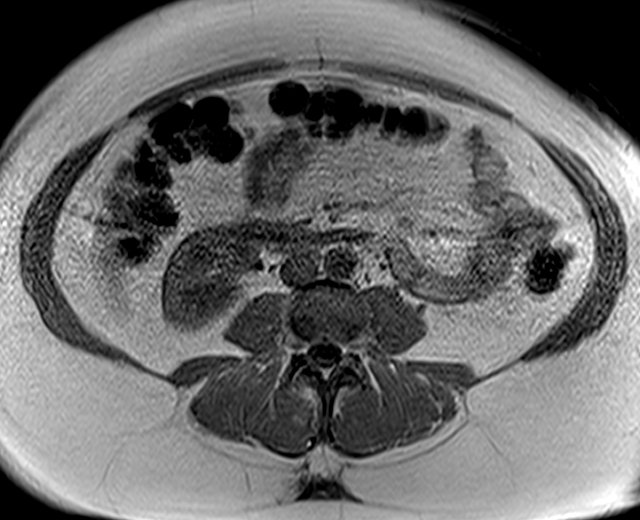
[im 26/64]
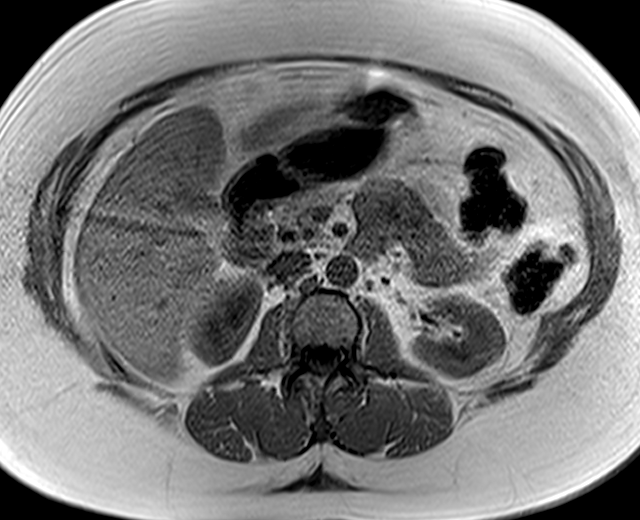
[im 38/64]
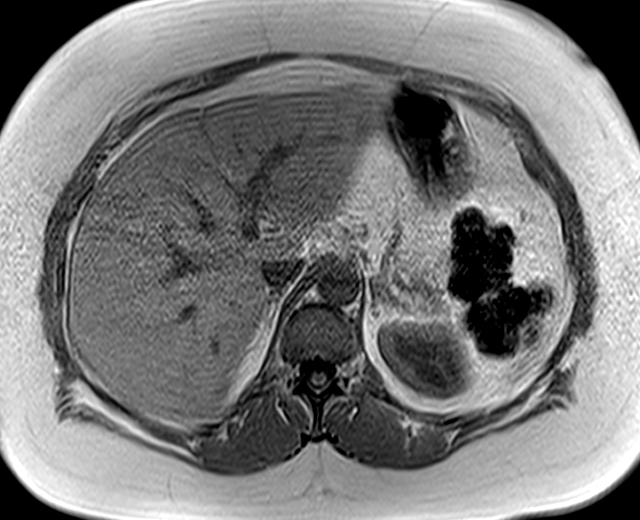
[im 51/64]
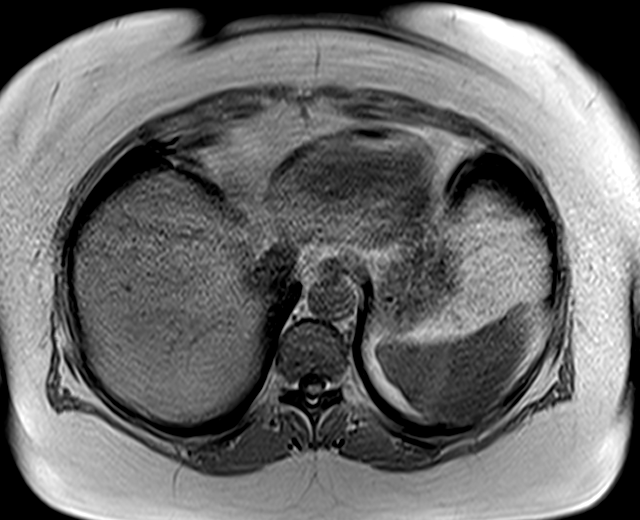
[im 64/64]
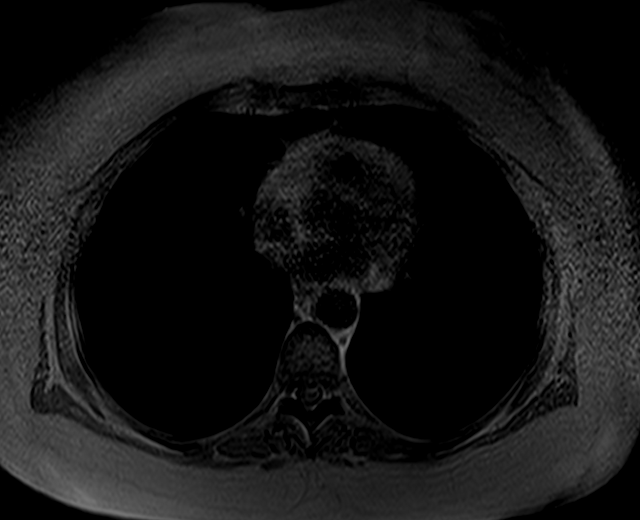

[Series 7: ax dual echo_opp · axial · 4.0mm · 0.55mm/px · z∈[-134,+118]mm · 6 of 64 slices shown]
[im 1/64]
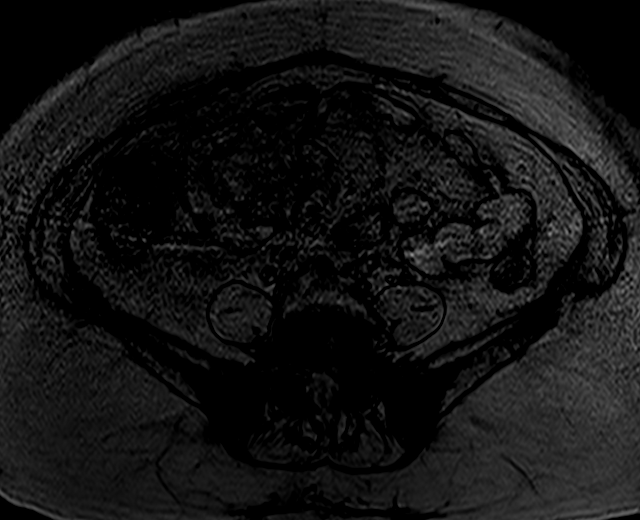
[im 13/64]
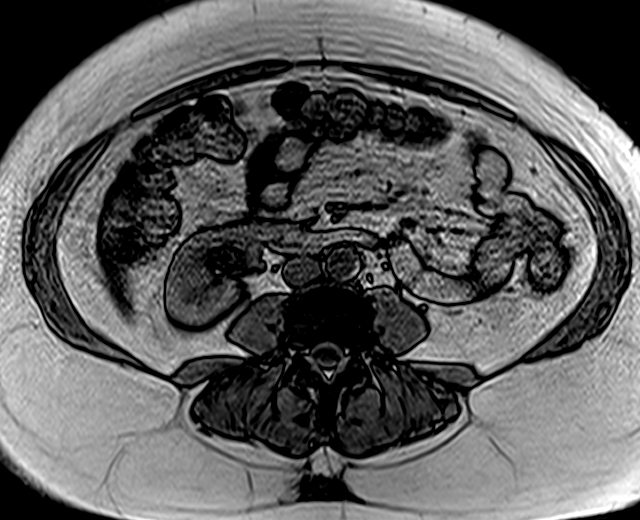
[im 26/64]
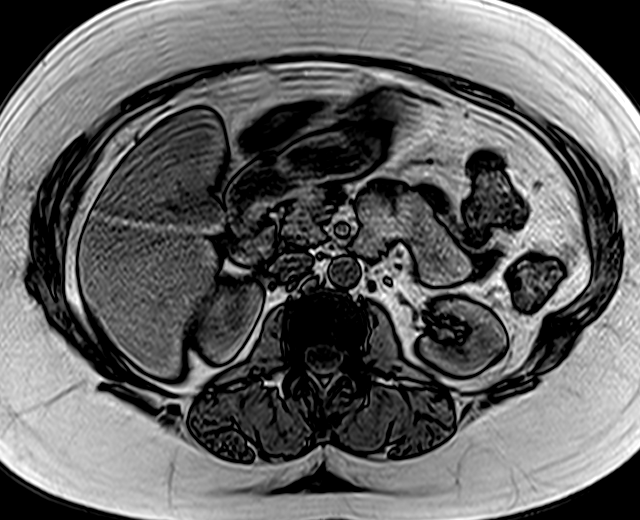
[im 38/64]
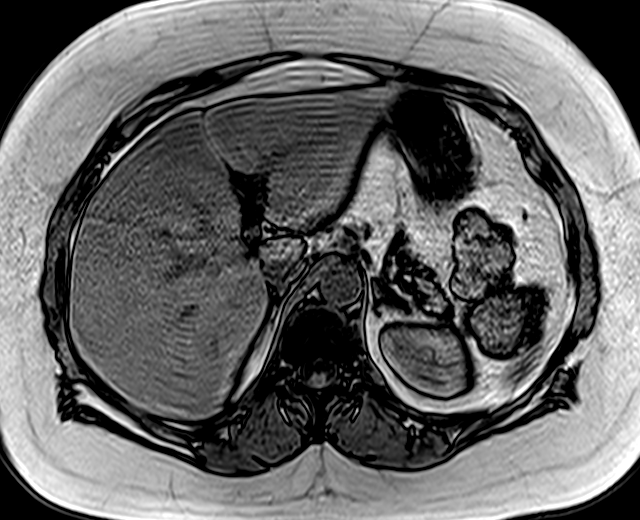
[im 51/64]
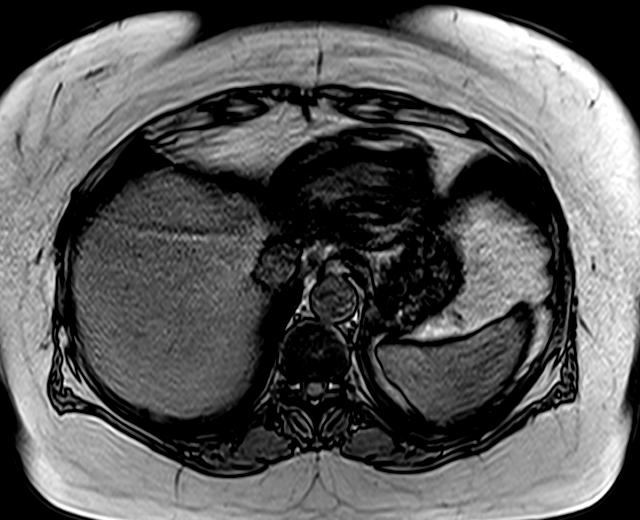
[im 64/64]
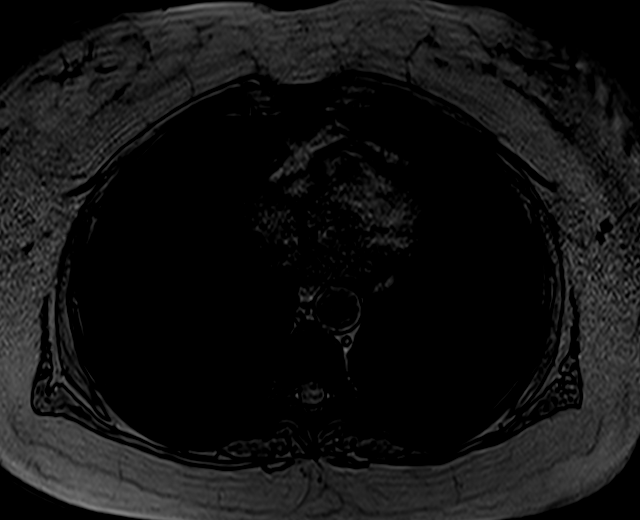

[Series 13: GRE · coronal · 5.0mm · 1.14mm/px · 3 of 36 slices shown]
[im 1/36]
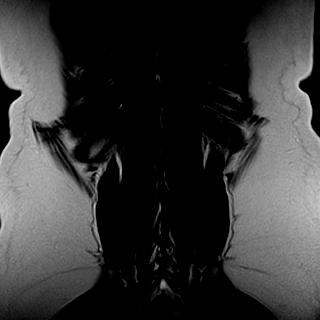
[im 18/36]
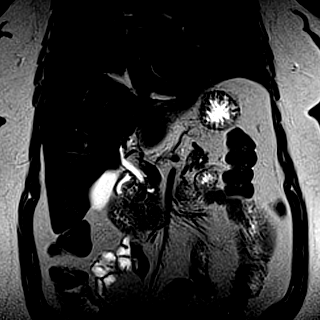
[im 36/36]
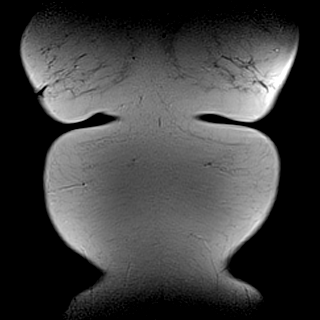

[Series 15: DWI · axial · 5.0mm · 0.89mm/px · 1 of 80 slices shown]
[im 1/80]
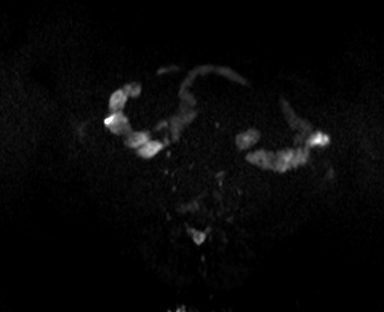

[21 of 48 positions shown; findings below may reference images not displayed]

FINDINGS: Lower chest:  Lung bases are clear.

Hepatobiliary: Normal signal intensity in the liver without evidence
of hepatic steatosis. Normal gallbladder and biliary tree. Common
bile duct is normal caliber. No cholelithiasis or
choledocholithiasis.

The pancreatic duct within normal limits. There is mild ductal
ectasia through the pancreatic head. Single small cystic lesion is
noted along the distal pancreatic duct within the pancreatic head
measuring 6 mm on image 18 series 13. This small cystic lesion may
communicate with the duct.

The pancreatic parenchyma appears normal without evidence of lesion.
No IV contrast administered. No pancreatic atrophy.

Pancreas: Normal pancreatic parenchymal intensity. No ductal
dilatation or inflammation.

Spleen: Normal spleen.

Adrenals/urinary tract: Adrenal glands and kidneys are normal.

Stomach/Bowel: Stomach and limited of the small bowel is
unremarkable

Vascular/Lymphatic: Abdominal aortic normal caliber. No
retroperitoneal periportal lymphadenopathy.

Musculoskeletal: No aggressive osseous lesion
IMPRESSION: 1. No lesion identified in the pancreatic head on this noncontrast
exam. No duct dilatation.
2. Small cystic lesion in the head of the pancreas along the
pancreatic duct. Differential includes a small cyst or IPMT.
Recommend follow-up MRI in 12 months from current month. This
recommendation follows ACR consensus guidelines: Management of
Incidental Pancreatic Cysts: A White Paper of the ACR Incidental
Findings Committee. [HOSPITAL] [id number];[DATE].
1. Normal liver, gallbladder and biliary tree.

## 2020-12-05 DIAGNOSIS — L209 Atopic dermatitis, unspecified: Secondary | ICD-10-CM | POA: Diagnosis not present

## 2020-12-05 DIAGNOSIS — M542 Cervicalgia: Secondary | ICD-10-CM | POA: Diagnosis not present

## 2020-12-05 DIAGNOSIS — M545 Low back pain, unspecified: Secondary | ICD-10-CM | POA: Diagnosis not present

## 2020-12-05 DIAGNOSIS — G4733 Obstructive sleep apnea (adult) (pediatric): Secondary | ICD-10-CM | POA: Diagnosis not present

## 2020-12-05 DIAGNOSIS — R69 Illness, unspecified: Secondary | ICD-10-CM | POA: Diagnosis not present

## 2020-12-05 DIAGNOSIS — R21 Rash and other nonspecific skin eruption: Secondary | ICD-10-CM | POA: Diagnosis not present

## 2020-12-05 DIAGNOSIS — G5603 Carpal tunnel syndrome, bilateral upper limbs: Secondary | ICD-10-CM | POA: Diagnosis not present

## 2020-12-05 DIAGNOSIS — M797 Fibromyalgia: Secondary | ICD-10-CM | POA: Diagnosis not present

## 2020-12-05 DIAGNOSIS — G4714 Hypersomnia due to medical condition: Secondary | ICD-10-CM | POA: Diagnosis not present

## 2020-12-05 DIAGNOSIS — Z79891 Long term (current) use of opiate analgesic: Secondary | ICD-10-CM | POA: Diagnosis not present

## 2021-01-29 DIAGNOSIS — G4733 Obstructive sleep apnea (adult) (pediatric): Secondary | ICD-10-CM | POA: Diagnosis not present

## 2021-01-29 DIAGNOSIS — G5603 Carpal tunnel syndrome, bilateral upper limbs: Secondary | ICD-10-CM | POA: Diagnosis not present

## 2021-01-29 DIAGNOSIS — R69 Illness, unspecified: Secondary | ICD-10-CM | POA: Diagnosis not present

## 2021-01-29 DIAGNOSIS — M797 Fibromyalgia: Secondary | ICD-10-CM | POA: Diagnosis not present

## 2021-01-29 DIAGNOSIS — M545 Low back pain, unspecified: Secondary | ICD-10-CM | POA: Diagnosis not present

## 2021-01-29 DIAGNOSIS — Z79891 Long term (current) use of opiate analgesic: Secondary | ICD-10-CM | POA: Diagnosis not present

## 2021-01-29 DIAGNOSIS — L209 Atopic dermatitis, unspecified: Secondary | ICD-10-CM | POA: Diagnosis not present

## 2021-01-29 DIAGNOSIS — R21 Rash and other nonspecific skin eruption: Secondary | ICD-10-CM | POA: Diagnosis not present

## 2021-01-29 DIAGNOSIS — G4714 Hypersomnia due to medical condition: Secondary | ICD-10-CM | POA: Diagnosis not present

## 2021-01-29 DIAGNOSIS — M542 Cervicalgia: Secondary | ICD-10-CM | POA: Diagnosis not present

## 2021-02-25 DIAGNOSIS — Z0100 Encounter for examination of eyes and vision without abnormal findings: Secondary | ICD-10-CM | POA: Diagnosis not present

## 2021-02-25 DIAGNOSIS — H5200 Hypermetropia, unspecified eye: Secondary | ICD-10-CM | POA: Diagnosis not present

## 2021-03-27 DIAGNOSIS — G4714 Hypersomnia due to medical condition: Secondary | ICD-10-CM | POA: Diagnosis not present

## 2021-03-27 DIAGNOSIS — L209 Atopic dermatitis, unspecified: Secondary | ICD-10-CM | POA: Diagnosis not present

## 2021-03-27 DIAGNOSIS — M545 Low back pain, unspecified: Secondary | ICD-10-CM | POA: Diagnosis not present

## 2021-03-27 DIAGNOSIS — G4733 Obstructive sleep apnea (adult) (pediatric): Secondary | ICD-10-CM | POA: Diagnosis not present

## 2021-03-27 DIAGNOSIS — G5603 Carpal tunnel syndrome, bilateral upper limbs: Secondary | ICD-10-CM | POA: Diagnosis not present

## 2021-03-27 DIAGNOSIS — Z79891 Long term (current) use of opiate analgesic: Secondary | ICD-10-CM | POA: Diagnosis not present

## 2021-03-27 DIAGNOSIS — R21 Rash and other nonspecific skin eruption: Secondary | ICD-10-CM | POA: Diagnosis not present

## 2021-03-27 DIAGNOSIS — M797 Fibromyalgia: Secondary | ICD-10-CM | POA: Diagnosis not present

## 2021-03-27 DIAGNOSIS — R69 Illness, unspecified: Secondary | ICD-10-CM | POA: Diagnosis not present

## 2021-03-27 DIAGNOSIS — M542 Cervicalgia: Secondary | ICD-10-CM | POA: Diagnosis not present

## 2021-06-03 ENCOUNTER — Encounter (INDEPENDENT_AMBULATORY_CARE_PROVIDER_SITE_OTHER): Payer: Self-pay | Admitting: Internal Medicine

## 2021-06-03 ENCOUNTER — Ambulatory Visit (INDEPENDENT_AMBULATORY_CARE_PROVIDER_SITE_OTHER): Payer: Medicare HMO | Admitting: Internal Medicine

## 2021-06-03 ENCOUNTER — Other Ambulatory Visit: Payer: Self-pay

## 2021-06-03 VITALS — BP 142/81 | HR 91 | Temp 98.4°F | Ht 64.0 in | Wt 218.5 lb

## 2021-06-03 DIAGNOSIS — K862 Cyst of pancreas: Secondary | ICD-10-CM | POA: Diagnosis not present

## 2021-06-03 DIAGNOSIS — R140 Abdominal distension (gaseous): Secondary | ICD-10-CM

## 2021-06-03 DIAGNOSIS — K5909 Other constipation: Secondary | ICD-10-CM

## 2021-06-03 MED ORDER — SIMETHICONE 180 MG PO CAPS: 180.0000 mg | ORAL_CAPSULE | Freq: Two times a day (BID) | ORAL | 0 refills | Status: DC | PRN

## 2021-06-03 MED ORDER — LINACLOTIDE 72 MCG PO CAPS
72.0000 ug | ORAL_CAPSULE | Freq: Every day | ORAL | 1 refills | Status: DC
Start: 2021-06-03 — End: 2021-08-21

## 2021-06-03 NOTE — Progress Notes (Signed)
Presenting complaint;  Constipation and bloating.  Database and subjective:  Patient is 61 year old Caucasian female who is here for scheduled visit. Patient says she has had increasing problems with constipation for the last 1 year.  She has taken polyethylene glycol but did not work well.  She gets diarrhea with Senokot and cramping with Dulcolax.  She is having no more than 2-3 bowel movements per week.  She rarely has sense of complete evacuation.  She complains of bloating.  She denies nausea vomiting melena or rectal bleeding.  She underwent screening colonoscopy in October 2020 prior to having appendectomy.  She was hospitalized in August 2020 for appendicitis and responded to antibiotic therapy and Dr. Constance Haw wanted to have colonoscopy prior to elective appendectomy.  Colonoscopy revealed 2 small tubular adenomas.  She had external hemorrhoids but no evidence of diverticulosis. Patient states her appetite is fine.  She has gained 7 pounds since September 2020. She has been under a lot of stress since she lost her son auto accident in December 2021.  She says prior to that she was very active and walk regularly but not anymore.  She does try to drink plenty of water she also drinks tea and milk as well as coffee.  Review of her meal diary reveals that she had popped out of breakfast yesterday and lunch was pulled.  At supper she had meatloaf pickled beets and corn. Patient says she takes gabapentin for fibromyalgia. 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  Current Medications: Outpatient Encounter Medications as of 06/03/2021  Medication Sig   cholecalciferol (VITAMIN D3) 25 MCG (1000 UT) tablet Take 1,000 Units by mouth daily.   docusate sodium (COLACE) 100 MG capsule Take 1 capsule (100 mg total) by mouth 2 (two) times daily.   gabapentin (NEURONTIN) 300 MG capsule Take 300 mg by mouth 2 (two) times a day.   HYDROcodone-acetaminophen (NORCO/VICODIN) 5-325 MG tablet  Take 1 tablet by mouth 2 (two) times daily as needed (for pain.).    meloxicam (MOBIC) 15 MG tablet Take 1 tablet (15 mg total) by mouth daily.   modafinil (PROVIGIL) 200 MG tablet Take 200 mg by mouth daily. 2 daily   [DISCONTINUED] DULoxetine (CYMBALTA) 60 MG capsule Take 60 mg by mouth 2 (two) times a day.   [DISCONTINUED] lisinopril (ZESTRIL) 10 MG tablet Take 1 tablet (10 mg total) by mouth daily.   No facility-administered encounter medications on file as of 06/03/2021.     Objective: Blood pressure (!) 142/81, pulse 91, temperature 98.4 F (36.9 C), temperature source Oral, height 5' 4"  (1.626 m), weight 218 lb 8 oz (99.1 kg). Patient is alert and in no acute distress. Conjunctiva is pink. Sclera is nonicteric Oropharyngeal mucosa is normal. No neck masses or thyromegaly noted. Cardiac exam with regular rhythm normal S1 and S2. No murmur or gallop noted. Lungs are clear to auscultation. Abdomen is full.  Bowel sounds are normal.  On palpation abdomen is soft and nontender with organomegaly or masses.   No LE edema or clubbing noted.  Labs/studies Results:   CBC Latest Ref Rng & Units 08/20/2020 08/24/2019 02/02/2019  WBC 3.4 - 10.8 x10E3/uL 6.6 8.5 9.0  Hemoglobin 11.1 - 15.9 g/dL 14.8 15.0 13.1  Hematocrit 34.0 - 46.6 % 44.9 44.8 39.9  Platelets 150 - 450 x10E3/uL 229 260 398    CMP Latest Ref Rng & Units 08/20/2020 08/24/2019 01/07/2019  Glucose 65 - 99 mg/dL 103(H) 104(H) 92  BUN 6 -  24 mg/dL 9 10 13   Creatinine 0.57 - 1.00 mg/dL 0.79 0.80 0.87  Sodium 134 - 144 mmol/L 142 137 136  Potassium 3.5 - 5.2 mmol/L 5.0 4.6 4.1  Chloride 96 - 106 mmol/L 102 100 98  CO2 20 - 29 mmol/L 20 29 27   Calcium 8.7 - 10.2 mg/dL 9.4 9.6 8.5(L)  Total Protein 6.0 - 8.5 g/dL 7.1 6.9 -  Total Bilirubin 0.0 - 1.2 mg/dL <0.2 0.3 -  Alkaline Phos 44 - 121 IU/L 107 - -  AST 0 - 40 IU/L 24 16 -  ALT 0 - 32 IU/L 22 22 -    Hepatic Function Latest Ref Rng & Units 08/20/2020 08/24/2019 01/03/2019   Total Protein 6.0 - 8.5 g/dL 7.1 6.9 7.9  Albumin 3.8 - 4.9 g/dL 4.3 - 3.7  AST 0 - 40 IU/L 24 16 22   ALT 0 - 32 IU/L 22 22 21   Alk Phosphatase 44 - 121 IU/L 107 - 80  Total Bilirubin 0.0 - 1.2 mg/dL <0.2 0.3 0.6    Lab studies from March, 2022 reviewed.  Assessment:  #1.  Chronic constipation.  Patient has idiopathic chronic constipation.  She has not done well with polyethylene glycol.  She is experiencing diarrhea and her cramping with Senokot and Dulcolax.  We will trial her on Linzess/linaclotide.  It remains to be seen if it would be covered by her insurance.  We will rule out hypothyroidism since she is gaining weight.  #2.  Bloating.  She does not have any alarm symptoms.  She will try Phazyme/simethicone on as-needed basis.  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  She had MR in November 2020 and last study was 1 year ago.  Cyst has remained stable.  Per radiology guidelines she should have yearly MR for total of 5 years.   Plan:  Simethicone 180 mg by mouth twice daily on as-needed basis. Linzess/linaclotide 72 mcg by mouth every morning.  4-week supply given along with prescription. Will check TSH. Patient advised not to take stimulant laxatives. She will keep stool diary for the next 4 weeks and call with progress report. MR pancreas to be scheduled. Office visit in 6 months.

## 2021-06-03 NOTE — Patient Instructions (Addendum)
High-fiber diet Phazyme 180 mg by mouth twice daily as needed for bloating. Physician will call with results of blood test/TSH Keep stool diary as to frequency and consistency of stools for the next 4 weeks and call with progress report.

## 2021-06-12 DIAGNOSIS — K5909 Other constipation: Secondary | ICD-10-CM | POA: Diagnosis not present

## 2021-06-13 LAB — TSH: TSH: 3.51 mIU/L (ref 0.40–4.50)

## 2021-06-19 ENCOUNTER — Telehealth (INDEPENDENT_AMBULATORY_CARE_PROVIDER_SITE_OTHER): Payer: Self-pay | Admitting: *Deleted

## 2021-06-19 NOTE — Telephone Encounter (Signed)
Pt states linzess 72 mcg made her go too much. Felt horrible taking it so she has stopped. She was taking one daily.   Can leave detailed message on her voicemail.

## 2021-06-20 NOTE — Telephone Encounter (Signed)
Per dr Laural Golden may take one every other day or try opening capsule and splitting med in half and taking half every day. Called and disucssed with pt and patient verbalized understanding.

## 2021-07-14 DIAGNOSIS — R69 Illness, unspecified: Secondary | ICD-10-CM | POA: Diagnosis not present

## 2021-07-14 DIAGNOSIS — G4714 Hypersomnia due to medical condition: Secondary | ICD-10-CM | POA: Diagnosis not present

## 2021-07-14 DIAGNOSIS — M545 Low back pain, unspecified: Secondary | ICD-10-CM | POA: Diagnosis not present

## 2021-07-14 DIAGNOSIS — Z79891 Long term (current) use of opiate analgesic: Secondary | ICD-10-CM | POA: Diagnosis not present

## 2021-07-14 DIAGNOSIS — L209 Atopic dermatitis, unspecified: Secondary | ICD-10-CM | POA: Diagnosis not present

## 2021-07-14 DIAGNOSIS — R21 Rash and other nonspecific skin eruption: Secondary | ICD-10-CM | POA: Diagnosis not present

## 2021-07-14 DIAGNOSIS — M542 Cervicalgia: Secondary | ICD-10-CM | POA: Diagnosis not present

## 2021-07-14 DIAGNOSIS — G4733 Obstructive sleep apnea (adult) (pediatric): Secondary | ICD-10-CM | POA: Diagnosis not present

## 2021-07-14 DIAGNOSIS — M797 Fibromyalgia: Secondary | ICD-10-CM | POA: Diagnosis not present

## 2021-07-14 DIAGNOSIS — G5603 Carpal tunnel syndrome, bilateral upper limbs: Secondary | ICD-10-CM | POA: Diagnosis not present

## 2021-08-21 ENCOUNTER — Ambulatory Visit (INDEPENDENT_AMBULATORY_CARE_PROVIDER_SITE_OTHER): Payer: Medicare HMO | Admitting: Internal Medicine

## 2021-08-21 ENCOUNTER — Encounter: Payer: Self-pay | Admitting: Internal Medicine

## 2021-08-21 ENCOUNTER — Encounter: Payer: BC Managed Care – PPO | Admitting: Family Medicine

## 2021-08-21 VITALS — BP 124/84 | HR 72 | Resp 18 | Ht 64.0 in | Wt 215.8 lb

## 2021-08-21 DIAGNOSIS — L281 Prurigo nodularis: Secondary | ICD-10-CM | POA: Insufficient documentation

## 2021-08-21 DIAGNOSIS — M797 Fibromyalgia: Secondary | ICD-10-CM | POA: Diagnosis not present

## 2021-08-21 DIAGNOSIS — Z0001 Encounter for general adult medical examination with abnormal findings: Secondary | ICD-10-CM | POA: Diagnosis not present

## 2021-08-21 DIAGNOSIS — M542 Cervicalgia: Secondary | ICD-10-CM | POA: Diagnosis not present

## 2021-08-21 DIAGNOSIS — R7303 Prediabetes: Secondary | ICD-10-CM | POA: Diagnosis not present

## 2021-08-21 DIAGNOSIS — E782 Mixed hyperlipidemia: Secondary | ICD-10-CM

## 2021-08-21 DIAGNOSIS — G4733 Obstructive sleep apnea (adult) (pediatric): Secondary | ICD-10-CM | POA: Diagnosis not present

## 2021-08-21 DIAGNOSIS — G5603 Carpal tunnel syndrome, bilateral upper limbs: Secondary | ICD-10-CM | POA: Diagnosis not present

## 2021-08-21 DIAGNOSIS — E559 Vitamin D deficiency, unspecified: Secondary | ICD-10-CM

## 2021-08-21 DIAGNOSIS — G4714 Hypersomnia due to medical condition: Secondary | ICD-10-CM | POA: Diagnosis not present

## 2021-08-21 DIAGNOSIS — Z720 Tobacco use: Secondary | ICD-10-CM | POA: Diagnosis not present

## 2021-08-21 DIAGNOSIS — M545 Low back pain, unspecified: Secondary | ICD-10-CM | POA: Diagnosis not present

## 2021-08-21 MED ORDER — TRIAMCINOLONE ACETONIDE 0.1 % EX CREA: 1.0000 "application " | TOPICAL_CREAM | Freq: Two times a day (BID) | CUTANEOUS | 0 refills | Status: DC

## 2021-08-21 NOTE — Assessment & Plan Note (Signed)
Physical exam as documented. ?Counseling done  re healthy lifestyle involving commitment to 150 minutes exercise per week, heart healthy diet, and attaining healthy weight.The importance of adequate sleep also discussed. ?Changes in health habits are decided on by the patient with goals and time frames  set for achieving them. ?Immunization and cancer screening needs are specifically addressed at this visit. ? ?Advised to get Shingrix vaccine at local pharmacy. ?

## 2021-08-21 NOTE — Assessment & Plan Note (Signed)
Check lipid profile Advised to follow low cholesterol diet for now 

## 2021-08-21 NOTE — Assessment & Plan Note (Signed)
On Effexor, Lyrica and as needed Norco ?Followed by [facility] ?

## 2021-08-21 NOTE — Assessment & Plan Note (Signed)
Smokes about 0.5 pack/day  Asked about quitting: confirms that he/she currently smokes cigarettes Advise to quit smoking: Educated about QUITTING to reduce the risk of cancer, cardio and cerebrovascular disease. Assess willingness: Unwilling to quit at this time, but is working on cutting back. Assist with counseling and pharmacotherapy: Counseled for 5 minutes and literature provided. Arrange for follow up: follow up in 3 months and continue to offer help. 

## 2021-08-21 NOTE — Assessment & Plan Note (Addendum)
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

## 2021-08-21 NOTE — Progress Notes (Signed)
? ?Established Patient Office Visit ? ?Subjective:  ?Patient ID: Amanda Wilcox, female    DOB: [birth year]  Age: 61 y.o. MRN: [id number] ? ?CC:  ?Chief Complaint  ?Patient presents with  ? Annual Exam  ?  Annual exam   ? ? ?HPI ?Amanda Wilcox is a 61 y.o. female with past medical history of fibromyalgia, prediabetes and HLD who presents for annual physical. ? ?She follows up with [facility] neurology for history of fibromyalgia, for which she takes Effexor, Lyrica and as needed Norco.  She also takes modafinil for hypersomnia.  She currently denies any SI or HI. ? ?She has multiple pruritic papules over her bilateral UE.  She has had dermatology eval in the past, and was given a topical agent for it, but does not recall name. ? ? ? ? ?Past Medical History:  ?Diagnosis Date  ? Acute perforated appendicitis 01/04/2019  ? Anxiety   ? Trying to quit smoking - has cut back.  ? Arthralgia   ? Colon cancer screening 02/02/2019  ? Fatigue   ? Generalized abdominal pain 01/02/2019  ? Neuromuscular disorder ([place])   ? fibromyalgia  ? Perforated appendicitis   ? Restless leg syndrome   ? Shortness of breath 01/02/2019  ? Sleep apnea   ? ? ?Past Surgical History:  ?Procedure Laterality Date  ? CARPAL TUNNEL RELEASE Right   ? COLONOSCOPY WITH PROPOFOL N/A 02/24/2019  ? Procedure: COLONOSCOPY WITH PROPOFOL;  Surgeon: Rogene Houston, MD;  Location: [facility];  Service: Endoscopy;  Laterality: N/A;  ? LAPAROSCOPIC APPENDECTOMY Right 03/08/2019  ? Procedure: APPENDECTOMY LAPAROSCOPIC;  Surgeon: Virl Cagey, MD;  Location: [facility] ORS;  Service: General;  Laterality: Right;  ? POLYPECTOMY  02/24/2019  ? Procedure: POLYPECTOMY;  Surgeon: Rogene Houston, MD;  Location: [facility];  Service: Endoscopy;;  sigmoid and transverse  ? ? ?Family History  ?Problem Relation Age of Onset  ? Stroke Mother   ? Epilepsy Maternal Aunt   ? Multiple sclerosis Maternal Uncle   ? Heart disease Maternal Grandfather   ? ? ?Social History   ? ?Socioeconomic History  ? Marital status: Divorced  ?  Spouse name: Not on file  ? Number of children: 3  ? Years of education: GED  ? Highest education level: Not on file  ?Occupational History  ? Occupation: Disability  ?Tobacco Use  ? Smoking status: Every Day  ?  Packs/day: 0.50  ?  Years: 40.00  ?  Pack years: 20.00  ?  Types: Cigarettes  ? Smokeless tobacco: Never  ?Vaping Use  ? Vaping Use: Never used  ?Substance and Sexual Activity  ? Alcohol use: No  ?  Alcohol/week: 0.0 standard drinks  ? Drug use: No  ? Sexual activity: Not Currently  ?  Birth control/protection: None, Post-menopausal  ?Other Topics Concern  ? Not on file  ?Social History Narrative  ? Lives with a Nino Glow  (roommate)  ? 1 dog: Tanko Water engineer mix)   ? Left-handed.   ?   ? Daughter Zebedee Iba- 3 grandchildren   ? Daughter Lattie Haw (in the army)   ? Son Darnell Level -2 grandchildren  ?    ? Enjoys spending time with best friends, game nights, cook outs, spending time with kids and grandchildren  ?   ? Diet: eggs, pancakes, oatmeal, sausage, chicken, does not get a lot of veggies and fruits like should. Does eat a lot more breads, meats.   ? Caffeine: 2 c coffee (  cream and sugar), will drink some soft drinks/but rare  ? Water: 4 cups daily  ?   ? Wears seat belt   ? Wears sunscreen  ? Smoke and carbon monoxide detectors  ? Does not use phone while driving  ? ?Social Determinants of Health  ? ?Financial Resource Strain: Not on file  ?Food Insecurity: Not on file  ?Transportation Needs: Not on file  ?Physical Activity: Not on file  ?Stress: Not on file  ?Social Connections: Not on file  ?Intimate Partner Violence: Not on file  ? ? ?Outpatient Medications Prior to Visit  ?Medication Sig Dispense Refill  ? cholecalciferol (VITAMIN D3) 25 MCG (1000 UT) tablet Take 1,000 Units by mouth daily.    ? docusate sodium (COLACE) 100 MG capsule Take 1 capsule (100 mg total) by mouth 2 (two) times daily. 60 capsule 2  ? HYDROcodone-acetaminophen  (NORCO/VICODIN) 5-325 MG tablet Take 1 tablet by mouth 2 (two) times daily as needed (for pain.).     ? meloxicam (MOBIC) 15 MG tablet Take 1 tablet (15 mg total) by mouth daily.    ? modafinil (PROVIGIL) 200 MG tablet Take 200 mg by mouth daily. 2 daily    ? pregabalin (LYRICA) 150 MG capsule Take 150 mg by mouth 2 (two) times daily.    ? venlafaxine (EFFEXOR) 100 MG tablet Take 100 mg by mouth 2 (two) times daily.    ? gabapentin (NEURONTIN) 300 MG capsule Take 300 mg by mouth 2 (two) times a day. (Patient not taking: Reported on 08/21/2021)    ? linaclotide (LINZESS) 72 MCG capsule Take 1 capsule (72 mcg total) by mouth daily before breakfast. (Patient not taking: Reported on 08/21/2021) 30 capsule 1  ? Simethicone 180 MG CAPS Take 1 capsule (180 mg total) by mouth 2 (two) times daily as needed. (Patient not taking: Reported on 08/21/2021)  0  ? ?No facility-administered medications prior to visit.  ? ? ?No Known Allergies ? ?ROS ?Review of Systems  ?Constitutional:  Negative for chills and fever.  ?HENT:  Negative for congestion, sinus pressure, sinus pain and sore throat.   ?Eyes:  Negative for pain and discharge.  ?Respiratory:  Negative for cough and shortness of breath.   ?Cardiovascular:  Negative for chest pain and palpitations.  ?Gastrointestinal:  Negative for abdominal pain, diarrhea, nausea and vomiting.  ?Endocrine: Negative for polydipsia and polyuria.  ?Genitourinary:  Negative for dysuria and hematuria.  ?Musculoskeletal:  Positive for back pain and myalgias. Negative for neck pain and neck stiffness.  ?Skin:  Positive for rash.  ?Neurological:  Negative for dizziness and weakness.  ?Psychiatric/Behavioral:  Negative for agitation and behavioral problems.   ? ?  ?Objective:  ?  ?Physical Exam ?Vitals reviewed.  ?Constitutional:   ?   General: She is not in acute distress. ?   Appearance: She is obese. She is not diaphoretic.  ?HENT:  ?   Head: Normocephalic and atraumatic.  ?   Nose: Nose normal. No  congestion.  ?   Mouth/Throat:  ?   Mouth: Mucous membranes are moist.  ?   Pharynx: No posterior oropharyngeal erythema.  ?Eyes:  ?   General: No scleral icterus. ?   Extraocular Movements: Extraocular movements intact.  ?Cardiovascular:  ?   Rate and Rhythm: Normal rate and regular rhythm.  ?   Pulses: Normal pulses.  ?   Heart sounds: Normal heart sounds. No murmur heard. ?Pulmonary:  ?   Breath sounds: Normal breath sounds. No wheezing  or rales.  ?Abdominal:  ?   Palpations: Abdomen is soft.  ?   Tenderness: There is no abdominal tenderness.  ?Musculoskeletal:  ?   Cervical back: Neck supple. No tenderness.  ?   Right lower leg: No edema.  ?   Left lower leg: No edema.  ?Skin: ?   General: Skin is warm.  ?   Findings: Rash (Multiple erythematous papules over b/l UE) present.  ?Neurological:  ?   General: No focal deficit present.  ?   Mental Status: She is alert and oriented to person, place, and time.  ?   Cranial Nerves: No cranial nerve deficit.  ?   Sensory: No sensory deficit.  ?   Motor: Weakness (B/l UE - 4/5) present.  ?Psychiatric:     ?   Mood and Affect: Mood normal.     ?   Behavior: Behavior normal.  ? ? ?BP 124/84 (BP Location: Right Arm, Patient Position: Sitting, Cuff Size: Normal)   Pulse 72   Resp 18   Ht _0  (1.626 m)   Wt 215 lb 12.8 oz (97.9 kg)   SpO2 95%   BMI 37.04 kg/m?  ?Wt Readings from Last 3 Encounters:  ?08/21/21 215 lb 12.8 oz (97.9 kg)  ?06/03/21 218 lb 8 oz (99.1 kg)  ?08/20/20 211 lb (95.7 kg)  ? ? ?Lab Results  ?Component Value Date  ? TSH 3.51 06/12/2021  ? ?Lab Results  ?Component Value Date  ? WBC 6.6 08/20/2020  ? HGB 14.8 08/20/2020  ? HCT 44.9 08/20/2020  ? MCV 91 08/20/2020  ? PLT 229 08/20/2020  ? ?Lab Results  ?Component Value Date  ? NA 142 08/20/2020  ? K 5.0 08/20/2020  ? CO2 20 08/20/2020  ? GLUCOSE 103 (H) 08/20/2020  ? BUN 9 08/20/2020  ? CREATININE 0.79 08/20/2020  ? BILITOT <0.2 08/20/2020  ? ALKPHOS 107 08/20/2020  ? AST 24 08/20/2020  ? ALT 22  08/20/2020  ? PROT 7.1 08/20/2020  ? ALBUMIN 4.3 08/20/2020  ? CALCIUM 9.4 08/20/2020  ? ANIONGAP 11 01/07/2019  ? EGFR 87 08/20/2020  ? ?Lab Results  ?Component Value Date  ? CHOL 213 (H) 08/20/2020  ? ?Lab Results  ?Component Value Da

## 2021-08-21 NOTE — Patient Instructions (Signed)
Please continue to take medications as prescribed. ? ?Please continue to follow low carb diet and perform moderate exercise/walking at least 150 mins/week. ?

## 2021-08-22 LAB — CMP14+EGFR
ALT: 18 IU/L (ref 0–32)
AST: 19 IU/L (ref 0–40)
Albumin/Globulin Ratio: 1.4 (ref 1.2–2.2)
Albumin: 4 g/dL (ref 3.8–4.9)
Alkaline Phosphatase: 113 IU/L (ref 44–121)
BUN/Creatinine Ratio: 12 (ref 9–23)
BUN: 9 mg/dL (ref 6–24)
Bilirubin Total: <0.2 mg/dL (ref 0.0–1.2)
CO2: 24 mmol/L (ref 20–29)
Calcium: 9.1 mg/dL (ref 8.7–10.2)
Chloride: 99 mmol/L (ref 96–106)
Creatinine, Ser: 0.73 mg/dL (ref 0.57–1.00)
Globulin, Total: 2.9 g/dL (ref 1.5–4.5)
Glucose: 106 mg/dL — ABNORMAL HIGH (ref 70–99)
Potassium: 4.7 mmol/L (ref 3.5–5.2)
Sodium: 136 mmol/L (ref 134–144)
Total Protein: 6.9 g/dL (ref 6.0–8.5)
eGFR: 95 mL/min/{1.73_m2} (ref >59)

## 2021-08-22 LAB — CBC WITH DIFFERENTIAL/PLATELET
Basophils Absolute: 0 10*3/uL (ref 0.0–0.2)
Basos: 0 %
EOS (ABSOLUTE): 0.1 10*3/uL (ref 0.0–0.4)
Eos: 2 %
Hematocrit: 46 % (ref 34.0–46.6)
Hemoglobin: 15.4 g/dL (ref 11.1–15.9)
Immature Grans (Abs): 0 10*3/uL (ref 0.0–0.1)
Immature Granulocytes: 0 %
Lymphocytes Absolute: 1.6 10*3/uL (ref 0.7–3.1)
Lymphs: 28 %
MCH: 30 pg (ref 26.6–33.0)
MCHC: 33.5 g/dL (ref 31.5–35.7)
MCV: 90 fL (ref 79–97)
Monocytes Absolute: 0.3 10*3/uL (ref 0.1–0.9)
Monocytes: 6 %
Neutrophils Absolute: 3.7 10*3/uL (ref 1.4–7.0)
Neutrophils: 64 %
Platelets: 221 10*3/uL (ref 150–450)
RBC: 5.14 x10E6/uL (ref 3.77–5.28)
RDW: 12 % (ref 11.7–15.4)
WBC: 5.8 10*3/uL (ref 3.4–10.8)

## 2021-08-22 LAB — LIPID PANEL
Chol/HDL Ratio: 3.6 ratio (ref 0.0–4.4)
Cholesterol, Total: 206 mg/dL — ABNORMAL HIGH (ref 100–199)
HDL: 58 mg/dL (ref >39)
LDL Chol Calc (NIH): 115 mg/dL — ABNORMAL HIGH (ref 0–99)
Triglycerides: 192 mg/dL — ABNORMAL HIGH (ref 0–149)
VLDL Cholesterol Cal: 33 mg/dL (ref 5–40)

## 2021-08-22 LAB — VITAMIN D 25 HYDROXY (VIT D DEFICIENCY, FRACTURES): Vit D, 25-Hydroxy: 27.1 ng/mL — ABNORMAL LOW (ref 30.0–100.0)

## 2021-08-22 LAB — HEMOGLOBIN A1C
Est. average glucose Bld gHb Est-mCnc: 134 mg/dL
Hgb A1c MFr Bld: 6.3 % — ABNORMAL HIGH (ref 4.8–5.6)

## 2021-08-22 LAB — TSH: TSH: 3.04 u[IU]/mL (ref 0.450–4.500)

## 2021-08-27 ENCOUNTER — Encounter: Payer: Self-pay | Admitting: *Deleted

## 2021-10-06 DIAGNOSIS — G4714 Hypersomnia due to medical condition: Secondary | ICD-10-CM | POA: Diagnosis not present

## 2021-10-06 DIAGNOSIS — M542 Cervicalgia: Secondary | ICD-10-CM | POA: Diagnosis not present

## 2021-10-06 DIAGNOSIS — Z79891 Long term (current) use of opiate analgesic: Secondary | ICD-10-CM | POA: Diagnosis not present

## 2021-10-06 DIAGNOSIS — M545 Low back pain, unspecified: Secondary | ICD-10-CM | POA: Diagnosis not present

## 2021-10-06 DIAGNOSIS — G5603 Carpal tunnel syndrome, bilateral upper limbs: Secondary | ICD-10-CM | POA: Diagnosis not present

## 2021-10-06 DIAGNOSIS — G4733 Obstructive sleep apnea (adult) (pediatric): Secondary | ICD-10-CM | POA: Diagnosis not present

## 2021-12-01 ENCOUNTER — Ambulatory Visit (INDEPENDENT_AMBULATORY_CARE_PROVIDER_SITE_OTHER): Payer: Medicare HMO | Admitting: Gastroenterology

## 2021-12-01 ENCOUNTER — Encounter (INDEPENDENT_AMBULATORY_CARE_PROVIDER_SITE_OTHER): Payer: Self-pay | Admitting: Gastroenterology

## 2021-12-29 DIAGNOSIS — Z79891 Long term (current) use of opiate analgesic: Secondary | ICD-10-CM | POA: Diagnosis not present

## 2021-12-29 DIAGNOSIS — M542 Cervicalgia: Secondary | ICD-10-CM | POA: Diagnosis not present

## 2021-12-29 DIAGNOSIS — G4714 Hypersomnia due to medical condition: Secondary | ICD-10-CM | POA: Diagnosis not present

## 2021-12-29 DIAGNOSIS — G5603 Carpal tunnel syndrome, bilateral upper limbs: Secondary | ICD-10-CM | POA: Diagnosis not present

## 2021-12-29 DIAGNOSIS — G4733 Obstructive sleep apnea (adult) (pediatric): Secondary | ICD-10-CM | POA: Diagnosis not present

## 2021-12-29 DIAGNOSIS — M545 Low back pain, unspecified: Secondary | ICD-10-CM | POA: Diagnosis not present

## 2022-01-02 DIAGNOSIS — Z0100 Encounter for examination of eyes and vision without abnormal findings: Secondary | ICD-10-CM | POA: Diagnosis not present

## 2022-03-02 DIAGNOSIS — H52223 Regular astigmatism, bilateral: Secondary | ICD-10-CM | POA: Diagnosis not present

## 2022-03-23 DIAGNOSIS — G5603 Carpal tunnel syndrome, bilateral upper limbs: Secondary | ICD-10-CM | POA: Diagnosis not present

## 2022-03-23 DIAGNOSIS — M545 Low back pain, unspecified: Secondary | ICD-10-CM | POA: Diagnosis not present

## 2022-03-23 DIAGNOSIS — M542 Cervicalgia: Secondary | ICD-10-CM | POA: Diagnosis not present

## 2022-03-23 DIAGNOSIS — Z79891 Long term (current) use of opiate analgesic: Secondary | ICD-10-CM | POA: Diagnosis not present

## 2022-05-07 DIAGNOSIS — M545 Low back pain, unspecified: Secondary | ICD-10-CM | POA: Diagnosis not present

## 2022-05-07 DIAGNOSIS — M542 Cervicalgia: Secondary | ICD-10-CM | POA: Diagnosis not present

## 2022-05-07 DIAGNOSIS — Z79891 Long term (current) use of opiate analgesic: Secondary | ICD-10-CM | POA: Diagnosis not present

## 2022-05-07 DIAGNOSIS — G5603 Carpal tunnel syndrome, bilateral upper limbs: Secondary | ICD-10-CM | POA: Diagnosis not present

## 2022-05-07 DIAGNOSIS — G4714 Hypersomnia due to medical condition: Secondary | ICD-10-CM | POA: Diagnosis not present

## 2022-05-07 DIAGNOSIS — G4733 Obstructive sleep apnea (adult) (pediatric): Secondary | ICD-10-CM | POA: Diagnosis not present

## 2022-05-13 ENCOUNTER — Encounter (INDEPENDENT_AMBULATORY_CARE_PROVIDER_SITE_OTHER): Payer: Self-pay | Admitting: *Deleted

## 2022-06-10 ENCOUNTER — Telehealth (INDEPENDENT_AMBULATORY_CARE_PROVIDER_SITE_OTHER): Payer: Self-pay | Admitting: *Deleted

## 2022-06-10 DIAGNOSIS — K862 Cyst of pancreas: Secondary | ICD-10-CM

## 2022-06-10 NOTE — Telephone Encounter (Signed)
Pt received recall letter for MRCP.

## 2022-06-10 NOTE — Telephone Encounter (Signed)
PA submitted via [employer] for MRI/MRCP. PA pending review. Ref#  [id number]

## 2022-06-15 NOTE — Telephone Encounter (Signed)
PA was denied and redone. New ref#  [id number]

## 2022-06-15 NOTE — Telephone Encounter (Signed)
PA approved. Auth#  [id number], DOS: 06/15/22-12/12/2022   MRI scheduled for 2/7, arrival 4:30pm, npo 4 hrs prior.  Called pt, she is aware of appt details

## 2022-07-01 ENCOUNTER — Other Ambulatory Visit (INDEPENDENT_AMBULATORY_CARE_PROVIDER_SITE_OTHER): Payer: Self-pay | Admitting: Gastroenterology

## 2022-07-01 ENCOUNTER — Ambulatory Visit (HOSPITAL_COMMUNITY)
Admission: RE | Admit: 2022-07-01 | Discharge: 2022-07-01 | Disposition: A | Discharge status: Home / Self Care | Payer: Medicare HMO | Source: Ambulatory Visit | Attending: Gastroenterology | Admitting: Gastroenterology

## 2022-07-01 ENCOUNTER — Encounter (INDEPENDENT_AMBULATORY_CARE_PROVIDER_SITE_OTHER): Payer: Self-pay | Admitting: Gastroenterology

## 2022-07-01 DIAGNOSIS — R935 Abnormal findings on diagnostic imaging of other abdominal regions, including retroperitoneum: Secondary | ICD-10-CM | POA: Diagnosis not present

## 2022-07-01 DIAGNOSIS — K862 Cyst of pancreas: Secondary | ICD-10-CM

## 2022-07-01 MED ORDER — GADOBUTROL 1 MMOL/ML IV SOLN
10.0000 mL | Freq: Once | INTRAVENOUS | Status: AC | PRN
  Administered 2022-07-01: 10 mL via INTRAVENOUS

## 2022-08-06 DIAGNOSIS — M5412 Radiculopathy, cervical region: Secondary | ICD-10-CM | POA: Diagnosis not present

## 2022-08-06 DIAGNOSIS — G4733 Obstructive sleep apnea (adult) (pediatric): Secondary | ICD-10-CM | POA: Diagnosis not present

## 2022-08-06 DIAGNOSIS — M545 Low back pain, unspecified: Secondary | ICD-10-CM | POA: Diagnosis not present

## 2022-08-24 ENCOUNTER — Encounter: Payer: Self-pay | Admitting: Internal Medicine

## 2022-08-24 ENCOUNTER — Ambulatory Visit (INDEPENDENT_AMBULATORY_CARE_PROVIDER_SITE_OTHER): Payer: Medicare HMO | Admitting: Internal Medicine

## 2022-08-24 VITALS — BP 139/75 | HR 76 | Ht 64.0 in | Wt 213.4 lb

## 2022-08-24 DIAGNOSIS — E782 Mixed hyperlipidemia: Secondary | ICD-10-CM | POA: Diagnosis not present

## 2022-08-24 DIAGNOSIS — M797 Fibromyalgia: Secondary | ICD-10-CM | POA: Diagnosis not present

## 2022-08-24 DIAGNOSIS — E669 Obesity, unspecified: Secondary | ICD-10-CM | POA: Diagnosis not present

## 2022-08-24 DIAGNOSIS — E559 Vitamin D deficiency, unspecified: Secondary | ICD-10-CM

## 2022-08-24 DIAGNOSIS — R7303 Prediabetes: Secondary | ICD-10-CM | POA: Diagnosis not present

## 2022-08-24 DIAGNOSIS — Z0001 Encounter for general adult medical examination with abnormal findings: Secondary | ICD-10-CM

## 2022-08-24 DIAGNOSIS — G4710 Hypersomnia, unspecified: Secondary | ICD-10-CM | POA: Diagnosis not present

## 2022-08-24 DIAGNOSIS — Z122 Encounter for screening for malignant neoplasm of respiratory organs: Secondary | ICD-10-CM

## 2022-08-24 DIAGNOSIS — K862 Cyst of pancreas: Secondary | ICD-10-CM

## 2022-08-24 NOTE — Assessment & Plan Note (Signed)
BMI Readings from Last 3 Encounters:  08/24/22 36.63 kg/m  08/21/21 37.04 kg/m  06/03/21 37.51 kg/m   Associated with HLD, prediabetes and fibromyalgia Advised to follow low card diet

## 2022-08-24 NOTE — Progress Notes (Signed)
Established Patient Office Visit  Subjective:  Patient ID: Amanda Wilcox, female    DOB: [birth year]  Age: 62 y.o. MRN: [id number]  CC:  Chief Complaint  Patient presents with   Annual Exam    HPI Amanda Wilcox is a 62 y.o. female with past medical history of fibromyalgia, prediabetes and HLD who presents for annual physical.  She follows up with [facility] neurology for history of fibromyalgia, for which she takes Effexor, Lyrica and as needed Norco.  She also takes modafinil for hypersomnia.  She currently denies any SI or HI.  She has hot flashes.  Of note, she is now not a candidate for HRT due to her current smoking.  She smokes about 0.5 pack/day.  Denies any dyspnea or wheezing currently.     Past Medical History:  Diagnosis Date   Acute perforated appendicitis 01/04/2019   Anxiety    Trying to quit smoking - has cut back.   Arthralgia    Colon cancer screening 02/02/2019   Fatigue    Generalized abdominal pain 01/02/2019   Neuromuscular disorder    fibromyalgia   Perforated appendicitis    Restless leg syndrome    Shortness of breath 01/02/2019   Sleep apnea     Past Surgical History:  Procedure Laterality Date   CARPAL TUNNEL RELEASE Right    COLONOSCOPY WITH PROPOFOL N/A 02/24/2019   Procedure: COLONOSCOPY WITH PROPOFOL;  Surgeon: Rogene Houston, MD;  [facility];  Service: Endoscopy;  Laterality: N/A;   LAPAROSCOPIC APPENDECTOMY Right 03/08/2019   Procedure: APPENDECTOMY LAPAROSCOPIC;  Surgeon: Virl Cagey, MD;  Location: [facility] ORS;  Service: General;  Laterality: Right;   POLYPECTOMY  02/24/2019   Procedure: POLYPECTOMY;  Surgeon: Rogene Houston, MD;  Location[facility];  Service: Endoscopy;;  sigmoid and transverse    Family History  Problem Relation Age of Onset   Stroke Mother    Epilepsy Maternal Aunt    Multiple sclerosis Maternal Uncle    Heart disease Maternal Grandfather     Social History   Socioeconomic History    Marital status: Divorced    Spouse name: Not on file   Number of children: 3   Years of education: GED   Highest education Wilcox: Not on file  Occupational History   Occupation: Disability  Tobacco Use   Smoking status: Every Day    Packs/day: 0.50    Years: 40.00    Additional pack years: 0.00    Total pack years: 20.00    Types: Cigarettes   Smokeless tobacco: Never  Vaping Use   Vaping Use: Never used  Substance and Sexual Activity   Alcohol use: No    Alcohol/week: 0.0 standard drinks of alcohol   Drug use: No   Sexual activity: Not Currently    Birth control/protection: None, Post-menopausal  Other Topics Concern   Not on file  Social History Narrative   Lives with a Personal assistant  (roommate)   1 dog: Tanko Water engineer mix)    Left-handed.       Daughter Amanda Wilcox- 3 grandchildren    Daughter Amanda Wilcox (in the army)    Son Amanda Wilcox -2 grandchildren       Enjoys spending time with best friends, game nights, cook outs, spending time with kids and grandchildren      Diet: eggs, pancakes, oatmeal, sausage, chicken, does not get a lot of veggies and fruits like should. Does eat a lot more breads, meats.  Caffeine: 2 c coffee (cream and sugar), will drink some soft drinks/but rare   Water: 4 cups daily      Wears seat belt    Wears sunscreen   Smoke and carbon monoxide detectors   Does not use phone while driving   Social Determinants of Health   Financial Resource Strain: Medium Risk (09/19/2019)   Overall Financial Resource Strain (CARDIA)    Difficulty of Paying Living Expenses: Somewhat hard  Food Insecurity: No Food Insecurity (09/19/2019)   Hunger Vital Sign    Worried About Running Out of Food in the Last Year: Never true    Ran Out of Food in the Last Year: Never true  Transportation Needs: No Transportation Needs (09/19/2019)   PRAPARE - Hydrologist (Medical): No    Lack of Transportation (Non-Medical): No  Physical Activity:  Insufficiently Active (09/19/2019)   Exercise Vital Sign    Days of Exercise per Week: 7 days    Minutes of Exercise per Session: 20 min  Stress: Unknown (09/19/2019)   Cut Off    Feeling of Stress : Patient declined  Social Connections: Socially Isolated (09/19/2019)   Social Connection and Isolation Panel [NHANES]    Frequency of Communication with Friends and Family: More than three times a week    Frequency of Social Gatherings with Friends and Family: More than three times a week    Attends Religious Services: Never    Marine scientist or Organizations: No    Attends Archivist Meetings: Never    Marital Status: Divorced  Human resources officer Violence: Not At Risk (09/19/2019)   Humiliation, Afraid, Rape, and Kick questionnaire    Fear of Current or Ex-Partner: No    Emotionally Abused: No    Physically Abused: No    Sexually Abused: No    Outpatient Medications Prior to Visit  Medication Sig Dispense Refill   cholecalciferol (VITAMIN D3) 25 MCG (1000 UT) tablet Take 1,000 Units by mouth daily.     docusate sodium (COLACE) 100 MG capsule Take 1 capsule (100 mg total) by mouth 2 (two) times daily. 60 capsule 2   HYDROcodone-acetaminophen (NORCO/VICODIN) 5-325 MG tablet Take 1 tablet by mouth 2 (two) times daily as needed (for pain.).      meloxicam (MOBIC) 15 MG tablet Take 1 tablet (15 mg total) by mouth daily.     modafinil (PROVIGIL) 200 MG tablet Take 200 mg by mouth daily. 2 daily     pregabalin (LYRICA) 150 MG capsule Take 150 mg by mouth 2 (two) times daily.     venlafaxine (EFFEXOR) 100 MG tablet Take 100 mg by mouth 2 (two) times daily.     triamcinolone cream (KENALOG) 0.1 % Apply 1 application. topically 2 (two) times daily. (Patient not taking: Reported on 08/24/2022) 30 g 0   No facility-administered medications prior to visit.    No Known Allergies  ROS Review of Systems   Constitutional:  Negative for chills and fever.  HENT:  Negative for congestion, sinus pressure, sinus pain and sore throat.   Eyes:  Negative for pain and discharge.  Respiratory:  Negative for cough and shortness of breath.   Cardiovascular:  Negative for chest pain and palpitations.  Gastrointestinal:  Negative for abdominal pain, diarrhea, nausea and vomiting.  Endocrine: Negative for polydipsia and polyuria.  Genitourinary:  Negative for dysuria and hematuria.  Musculoskeletal:  Positive for back pain and  myalgias. Negative for neck pain and neck stiffness.  Skin:  Positive for rash.  Neurological:  Negative for dizziness and weakness.  Psychiatric/Behavioral:  Negative for agitation and behavioral problems.       Objective:    Physical Exam Vitals reviewed.  Constitutional:      General: She is not in acute distress.    Appearance: She is obese. She is not diaphoretic.  HENT:     Head: Normocephalic and atraumatic.     Nose: Nose normal. No congestion.     Mouth/Throat:     Mouth: Mucous membranes are moist.     Pharynx: No posterior oropharyngeal erythema.  Eyes:     General: No scleral icterus.    Extraocular Movements: Extraocular movements intact.  Cardiovascular:     Rate and Rhythm: Normal rate and regular rhythm.     Pulses: Normal pulses.     Heart sounds: Normal heart sounds. No murmur heard. Pulmonary:     Breath sounds: Normal breath sounds. No wheezing or rales.  Abdominal:     Palpations: Abdomen is soft.     Tenderness: There is no abdominal tenderness.  Musculoskeletal:     Cervical back: Neck supple. No tenderness.     Right lower leg: No edema.     Left lower leg: No edema.  Skin:    General: Skin is warm.     Findings: Rash (Multiple erythematous papules over b/l UE) present.  Neurological:     General: No focal deficit present.     Mental Status: She is alert and oriented to person, place, and time.     Cranial Nerves: No cranial nerve  deficit.     Sensory: No sensory deficit.     Motor: Weakness (B/l UE - 4/5) present.  Psychiatric:        Mood and Affect: Mood normal.        Behavior: Behavior normal.     BP 139/75 (BP Location: Right Arm, Patient Position: Sitting, Cuff Size: Large)   Pulse 76   Ht 5\' 4"  (1.626 m)   Wt 213 lb 6.4 oz (96.8 kg)   SpO2 90%   BMI 36.63 kg/m  Wt Readings from Last 3 Encounters:  08/24/22 213 lb 6.4 oz (96.8 kg)  08/21/21 215 lb 12.8 oz (97.9 kg)  06/03/21 218 lb 8 oz (99.1 kg)    Lab Results  Component Value Date   TSH 3.040 08/21/2021   Lab Results  Component Value Date   WBC 5.8 08/21/2021   HGB 15.4 08/21/2021   HCT 46.0 08/21/2021   MCV 90 08/21/2021   PLT 221 08/21/2021   Lab Results  Component Value Date   NA 136 08/21/2021   K 4.7 08/21/2021   CO2 24 08/21/2021   GLUCOSE 106 (H) 08/21/2021   BUN 9 08/21/2021   CREATININE 0.73 08/21/2021   BILITOT <0.2 08/21/2021   ALKPHOS 113 08/21/2021   AST 19 08/21/2021   ALT 18 08/21/2021   PROT 6.9 08/21/2021   ALBUMIN 4.0 08/21/2021   CALCIUM 9.1 08/21/2021   ANIONGAP 11 01/07/2019   EGFR 95 08/21/2021   Lab Results  Component Value Date   CHOL 206 (H) 08/21/2021   Lab Results  Component Value Date   HDL 58 08/21/2021   Lab Results  Component Value Date   LDLCALC 115 (H) 08/21/2021   Lab Results  Component Value Date   TRIG 192 (H) 08/21/2021   Lab Results  Component Value Date  CHOLHDL 3.6 08/21/2021   Lab Results  Component Value Date   HGBA1C 6.3 (H) 08/21/2021      Assessment & Plan:   Problem List Items Addressed This Visit       Digestive   Pancreatic cyst    Followed by GI        Other   Fibromyalgia    On Effexor, Lyrica and as needed Norco Followed by Barton Fanny at [facility]       Relevant Orders   TSH   CBC with Differential/Platelet   Morbid obesity    BMI Readings from Last 3 Encounters:  08/24/22 36.63 kg/m  08/21/21 37.04 kg/m  06/03/21  37.51 kg/m  Associated with HLD, prediabetes and fibromyalgia Advised to follow low card diet      Hyperlipidemia   Relevant Orders   Lipid panel   Vitamin D deficiency   Relevant Orders   VITAMIN D 25 Hydroxy (Vit-D Deficiency, Fractures)   Encounter for general adult medical examination with abnormal findings - Primary    Physical exam as documented. Counseling done  re healthy lifestyle involving commitment to 150 minutes exercise per week, heart healthy diet, and attaining healthy weight.The importance of adequate sleep also discussed. Changes in health habits are decided on by the patient with goals and time frames  set for achieving them. Immunization and cancer screening needs are specifically addressed at this visit.  Advised to get Shingrix and Tdap vaccine at local pharmacy. Needs Mammography. PAP smear with Ob.Gyn.      Hypersomnia    Takes Modafinil Followed by Barton Fanny at [facility]      Relevant Orders   TSH   Prediabetes    Lab Results  Component Value Date   HGBA1C 6.3 (H) 08/21/2021  Advised to follow low carb diet for now      Relevant Orders   Hemoglobin A1c   CMP14+EGFR   Other Visit Diagnoses     Screening for lung cancer       Relevant Orders   CT CHEST LUNG CANCER SCREENING LOW DOSE WO CONTRAST       No orders of the defined types were placed in this encounter.   Follow-up: Return in about 6 months (around 02/23/2023) for Prediabetes and HLD.    Lindell Spar, MD

## 2022-08-24 NOTE — Assessment & Plan Note (Signed)
Physical exam as documented. Counseling done  re healthy lifestyle involving commitment to 150 minutes exercise per week, heart healthy diet, and attaining healthy weight.The importance of adequate sleep also discussed. Changes in health habits are decided on by the patient with goals and time frames  set for achieving them. Immunization and cancer screening needs are specifically addressed at this visit.  Advised to get Shingrix and Tdap vaccine at local pharmacy. Needs Mammography. PAP smear with Ob.Gyn.

## 2022-08-24 NOTE — Assessment & Plan Note (Addendum)
On Effexor, Lyrica and as needed Norco Followed by Barton Fanny at [facility]

## 2022-08-24 NOTE — Assessment & Plan Note (Signed)
Lab Results  Component Value Date   HGBA1C 6.3 (H) 08/21/2021   Advised to follow low carb diet for now

## 2022-08-24 NOTE — Assessment & Plan Note (Signed)
Takes Modafinil Followed by Barton Fanny at [facility]

## 2022-08-24 NOTE — Assessment & Plan Note (Signed)
Followed by GI

## 2022-08-25 LAB — CBC WITH DIFFERENTIAL/PLATELET
Basophils Absolute: 0 10*3/uL (ref 0.0–0.2)
Basos: 0 %
EOS (ABSOLUTE): 0.1 10*3/uL (ref 0.0–0.4)
Eos: 1 %
Hematocrit: 45.5 % (ref 34.0–46.6)
Hemoglobin: 14.9 g/dL (ref 11.1–15.9)
Immature Grans (Abs): 0 10*3/uL (ref 0.0–0.1)
Immature Granulocytes: 0 %
Lymphocytes Absolute: 1.7 10*3/uL (ref 0.7–3.1)
Lymphs: 25 %
MCH: 30.3 pg (ref 26.6–33.0)
MCHC: 32.7 g/dL (ref 31.5–35.7)
MCV: 93 fL (ref 79–97)
Monocytes Absolute: 0.4 10*3/uL (ref 0.1–0.9)
Monocytes: 7 %
Neutrophils Absolute: 4.6 10*3/uL (ref 1.4–7.0)
Neutrophils: 67 %
Platelets: 181 10*3/uL (ref 150–450)
RBC: 4.92 x10E6/uL (ref 3.77–5.28)
RDW: 12.5 % (ref 11.7–15.4)
WBC: 6.8 10*3/uL (ref 3.4–10.8)

## 2022-08-25 LAB — LIPID PANEL
Chol/HDL Ratio: 3.4 ratio (ref 0.0–4.4)
Cholesterol, Total: 175 mg/dL (ref 100–199)
HDL: 52 mg/dL (ref >39)
LDL Chol Calc (NIH): 83 mg/dL (ref 0–99)
Triglycerides: 244 mg/dL — ABNORMAL HIGH (ref 0–149)
VLDL Cholesterol Cal: 40 mg/dL (ref 5–40)

## 2022-08-25 LAB — CMP14+EGFR
ALT: 17 IU/L (ref 0–32)
AST: 17 IU/L (ref 0–40)
Albumin/Globulin Ratio: 1.8 (ref 1.2–2.2)
Albumin: 4.1 g/dL (ref 3.8–4.9)
Alkaline Phosphatase: 98 IU/L (ref 44–121)
BUN/Creatinine Ratio: 11 — ABNORMAL LOW (ref 12–28)
BUN: 9 mg/dL (ref 8–27)
Bilirubin Total: <0.2 mg/dL (ref 0.0–1.2)
CO2: 22 mmol/L (ref 20–29)
Calcium: 9 mg/dL (ref 8.7–10.3)
Chloride: 103 mmol/L (ref 96–106)
Creatinine, Ser: 0.81 mg/dL (ref 0.57–1.00)
Globulin, Total: 2.3 g/dL (ref 1.5–4.5)
Glucose: 97 mg/dL (ref 70–99)
Potassium: 4.7 mmol/L (ref 3.5–5.2)
Sodium: 140 mmol/L (ref 134–144)
Total Protein: 6.4 g/dL (ref 6.0–8.5)
eGFR: 83 mL/min/{1.73_m2} (ref >59)

## 2022-08-25 LAB — VITAMIN D 25 HYDROXY (VIT D DEFICIENCY, FRACTURES): Vit D, 25-Hydroxy: 32.6 ng/mL (ref 30.0–100.0)

## 2022-08-25 LAB — HEMOGLOBIN A1C
Est. average glucose Bld gHb Est-mCnc: 134 mg/dL
Hgb A1c MFr Bld: 6.3 % — ABNORMAL HIGH (ref 4.8–5.6)

## 2022-08-25 LAB — TSH: TSH: 2.05 u[IU]/mL (ref 0.450–4.500)

## 2022-11-10 DIAGNOSIS — G8929 Other chronic pain: Secondary | ICD-10-CM | POA: Diagnosis not present

## 2022-11-10 DIAGNOSIS — G4733 Obstructive sleep apnea (adult) (pediatric): Secondary | ICD-10-CM | POA: Diagnosis not present

## 2022-12-07 ENCOUNTER — Encounter: Payer: Self-pay | Admitting: *Deleted

## 2022-12-21 ENCOUNTER — Other Ambulatory Visit: Payer: Self-pay | Admitting: Internal Medicine

## 2022-12-21 DIAGNOSIS — Z1231 Encounter for screening mammogram for malignant neoplasm of breast: Secondary | ICD-10-CM

## 2022-12-28 DIAGNOSIS — M545 Low back pain, unspecified: Secondary | ICD-10-CM | POA: Diagnosis not present

## 2023-01-07 DIAGNOSIS — G4733 Obstructive sleep apnea (adult) (pediatric): Secondary | ICD-10-CM | POA: Diagnosis not present

## 2023-01-07 DIAGNOSIS — M5432 Sciatica, left side: Secondary | ICD-10-CM | POA: Diagnosis not present

## 2023-01-07 DIAGNOSIS — M5416 Radiculopathy, lumbar region: Secondary | ICD-10-CM | POA: Diagnosis not present

## 2023-01-07 DIAGNOSIS — M542 Cervicalgia: Secondary | ICD-10-CM | POA: Diagnosis not present

## 2023-01-07 DIAGNOSIS — M545 Low back pain, unspecified: Secondary | ICD-10-CM | POA: Diagnosis not present

## 2023-01-07 DIAGNOSIS — G4710 Hypersomnia, unspecified: Secondary | ICD-10-CM | POA: Diagnosis not present

## 2023-01-07 DIAGNOSIS — Z79899 Other long term (current) drug therapy: Secondary | ICD-10-CM | POA: Diagnosis not present

## 2023-02-10 DIAGNOSIS — G4710 Hypersomnia, unspecified: Secondary | ICD-10-CM | POA: Diagnosis not present

## 2023-02-10 DIAGNOSIS — M5432 Sciatica, left side: Secondary | ICD-10-CM | POA: Diagnosis not present

## 2023-02-10 DIAGNOSIS — M5416 Radiculopathy, lumbar region: Secondary | ICD-10-CM | POA: Diagnosis not present

## 2023-02-10 DIAGNOSIS — M542 Cervicalgia: Secondary | ICD-10-CM | POA: Diagnosis not present

## 2023-02-10 DIAGNOSIS — M545 Low back pain, unspecified: Secondary | ICD-10-CM | POA: Diagnosis not present

## 2023-02-10 DIAGNOSIS — Z79899 Other long term (current) drug therapy: Secondary | ICD-10-CM | POA: Diagnosis not present

## 2023-02-10 DIAGNOSIS — G4733 Obstructive sleep apnea (adult) (pediatric): Secondary | ICD-10-CM | POA: Diagnosis not present

## 2023-02-25 ENCOUNTER — Ambulatory Visit: Payer: Medicare HMO | Admitting: Internal Medicine

## 2023-04-12 ENCOUNTER — Ambulatory Visit: Payer: Medicare HMO | Admitting: Internal Medicine

## 2023-04-13 DIAGNOSIS — M545 Low back pain, unspecified: Secondary | ICD-10-CM | POA: Diagnosis not present

## 2023-04-13 DIAGNOSIS — Z79899 Other long term (current) drug therapy: Secondary | ICD-10-CM | POA: Diagnosis not present

## 2023-04-13 DIAGNOSIS — M5432 Sciatica, left side: Secondary | ICD-10-CM | POA: Diagnosis not present

## 2023-04-13 DIAGNOSIS — M5416 Radiculopathy, lumbar region: Secondary | ICD-10-CM | POA: Diagnosis not present

## 2023-06-02 ENCOUNTER — Other Ambulatory Visit (HOSPITAL_COMMUNITY): Payer: Self-pay | Admitting: Neurology

## 2023-06-02 DIAGNOSIS — M5416 Radiculopathy, lumbar region: Secondary | ICD-10-CM

## 2023-06-03 ENCOUNTER — Ambulatory Visit (HOSPITAL_COMMUNITY)
Admission: RE | Admit: 2023-06-03 | Discharge: 2023-06-03 | Disposition: A | Discharge status: Home / Self Care | Payer: Medicare HMO | Source: Ambulatory Visit | Attending: Neurology | Admitting: Neurology

## 2023-06-03 DIAGNOSIS — M5116 Intervertebral disc disorders with radiculopathy, lumbar region: Secondary | ICD-10-CM | POA: Diagnosis not present

## 2023-06-03 DIAGNOSIS — M5416 Radiculopathy, lumbar region: Secondary | ICD-10-CM | POA: Insufficient documentation

## 2023-06-03 DIAGNOSIS — M48061 Spinal stenosis, lumbar region without neurogenic claudication: Secondary | ICD-10-CM | POA: Diagnosis not present

## 2023-06-03 DIAGNOSIS — M5117 Intervertebral disc disorders with radiculopathy, lumbosacral region: Secondary | ICD-10-CM | POA: Diagnosis not present

## 2023-06-08 ENCOUNTER — Encounter: Payer: Self-pay | Admitting: Internal Medicine

## 2023-06-08 ENCOUNTER — Telehealth: Payer: Self-pay | Admitting: Internal Medicine

## 2023-06-08 ENCOUNTER — Ambulatory Visit (INDEPENDENT_AMBULATORY_CARE_PROVIDER_SITE_OTHER): Payer: Medicare HMO | Admitting: Internal Medicine

## 2023-06-08 VITALS — BP 110/72 | HR 92 | Ht 64.0 in | Wt 207.2 lb

## 2023-06-08 DIAGNOSIS — Z720 Tobacco use: Secondary | ICD-10-CM | POA: Diagnosis not present

## 2023-06-08 DIAGNOSIS — G4710 Hypersomnia, unspecified: Secondary | ICD-10-CM

## 2023-06-08 DIAGNOSIS — E559 Vitamin D deficiency, unspecified: Secondary | ICD-10-CM

## 2023-06-08 DIAGNOSIS — M797 Fibromyalgia: Secondary | ICD-10-CM

## 2023-06-08 DIAGNOSIS — M545 Low back pain, unspecified: Secondary | ICD-10-CM | POA: Diagnosis not present

## 2023-06-08 DIAGNOSIS — E782 Mixed hyperlipidemia: Secondary | ICD-10-CM | POA: Diagnosis not present

## 2023-06-08 DIAGNOSIS — R7303 Prediabetes: Secondary | ICD-10-CM | POA: Diagnosis not present

## 2023-06-08 DIAGNOSIS — G8929 Other chronic pain: Secondary | ICD-10-CM | POA: Insufficient documentation

## 2023-06-08 NOTE — Assessment & Plan Note (Signed)
 Currently prescribed modafinil, which is managed by [facility] neurology.

## 2023-06-08 NOTE — Patient Instructions (Signed)
 It was a pleasure to see you today.  Thank you for giving us  the opportunity to be involved in your care.  Below is a brief recap of your visit and next steps.  We will plan to see you again in 3 months.  Summary No medication changes today We will plan for follow up and repeat labs in the 3 months Please let me know how I can help with smoking cessation

## 2023-06-08 NOTE — Assessment & Plan Note (Signed)
 Currently smokes 0.5 packs/day of cigarettes and has been smoking since age 63.  She is contemplating cessation but has not selected a quit date.  -Patient was congratulated on taking the initial steps towards smoking cessation.  I recommended that she select a quit date as a next step.  We discussed treatment options to assist with smoking cessation.  She is interested in nicotine replacement therapy.  She will notify our office when she is ready to quit and appropriate NRT will be prescribed. -Indications for lung cancer screening were reviewed.  For now, she has declined a referral to the lung cancer screening program.

## 2023-06-08 NOTE — Assessment & Plan Note (Signed)
 Chronic lumbar back pain.  Currently prescribed Norco for as needed pain relief.  She also takes meloxicam and tizanidine regularly.  Recently underwent MRI of the lumbar spine.  Read is pending.  Followed by [facility] neurology.

## 2023-06-08 NOTE — Assessment & Plan Note (Signed)
 Adequately controlled with Effexor, Lyrica, and Norco.  Followed by [facility] neurology Jorge Mandril)

## 2023-06-08 NOTE — Assessment & Plan Note (Signed)
 A1c 6.3 on labs from April 2024.  She has focused on lifestyle modifications aimed at weight loss.  Repeat labs prior to follow-up in 3 months.

## 2023-06-08 NOTE — Telephone Encounter (Signed)
 Spoke to patient, patient scheduled an appointment with Dr Durwin Nora herself on mychart.  Reason for switch feels like Dr Durwin Nora is a better fit for her. Dixon ok with the switch.

## 2023-06-08 NOTE — Progress Notes (Signed)
 Established Patient Office Visit  Subjective   Patient ID: Amanda Wilcox, female    DOB: [birth year]  Age: 63 y.o. MRN: [id number]  Chief Complaint  Patient presents with   Care Management    Follow up , switching providers    Amanda Wilcox returns to care today for follow-up and to discuss switching providers.  She was last evaluated at [facility] on 08/24/22.  In the interim she has been evaluated by neurology on multiple occasions in the setting of chronic back pain, hypersomnia, and fibromyalgia.  She reports feeling well today.  She does not have any acute concerns to discuss aside from requesting to switch providers.  Chronic medical conditions and outstanding preventative care items discussed today are individually addressed in A/P below.  Past Medical History:  Diagnosis Date   Acute perforated appendicitis 01/04/2019   Anxiety    Trying to quit smoking - has cut back.   Arthralgia    Colon cancer screening 02/02/2019   Fatigue    Generalized abdominal pain 01/02/2019   Neuromuscular disorder (HCC)    fibromyalgia   Perforated appendicitis    Restless leg syndrome    Shortness of breath 01/02/2019   Sleep apnea    Past Surgical History:  Procedure Laterality Date   CARPAL TUNNEL RELEASE Right    COLONOSCOPY WITH PROPOFOL  N/A 02/24/2019   Procedure: COLONOSCOPY WITH PROPOFOL ;  Surgeon: Golda Claudis PENNER, MD;  [facility];  Service: Endoscopy;  Laterality: N/A;   LAPAROSCOPIC APPENDECTOMY Right 03/08/2019   Procedure: APPENDECTOMY LAPAROSCOPIC;  Surgeon: Kallie Manuelita BROCKS, MD;  Location: [facility];  Service: General;  Laterality: Right;   POLYPECTOMY  02/24/2019   Procedure: POLYPECTOMY;  Surgeon: Golda Claudis PENNER, MD;  [facility];  Service: Endoscopy;;  sigmoid and transverse   Social History   Tobacco Use   Smoking status: Every Day    Current packs/day: 0.50    Average packs/day: 0.5 packs/day for 40.0 years (20.0 ttl pk-yrs)    Types: Cigarettes    Smokeless tobacco: Never  Vaping Use   Vaping status: Never Used  Substance Use Topics   Alcohol use: No    Alcohol/week: 0.0 standard drinks of alcohol   Drug use: No   Family History  Problem Relation Age of Onset   Stroke Mother    Epilepsy Maternal Aunt    Multiple sclerosis Maternal Uncle    Heart disease Maternal Grandfather    No Known Allergies  Review of Systems  Musculoskeletal:  Positive for back pain (Chronic), joint pain and myalgias.  All other systems reviewed and are negative.    Objective:     BP 110/72 (BP Location: Left Arm, Patient Position: Sitting, Cuff Size: Large)   Pulse 92   Ht 5' 4 (1.626 m)   Wt 207 lb 3.2 oz (94 kg)   SpO2 90%   BMI 35.57 kg/m  BP Readings from Last 3 Encounters:  06/08/23 110/72  08/24/22 139/75  08/21/21 124/84   Physical Exam Vitals reviewed.  Constitutional:      General: She is not in acute distress.    Appearance: Normal appearance. She is not toxic-appearing.  HENT:     Head: Normocephalic and atraumatic.     Right Ear: External ear normal.     Left Ear: External ear normal.     Nose: Nose normal. No congestion or rhinorrhea.     Mouth/Throat:     Mouth: Mucous membranes are moist.  Pharynx: Oropharynx is clear. No oropharyngeal exudate or posterior oropharyngeal erythema.  Eyes:     General: No scleral icterus.    Extraocular Movements: Extraocular movements intact.     Conjunctiva/sclera: Conjunctivae normal.     Pupils: Pupils are equal, round, and reactive to light.  Cardiovascular:     Rate and Rhythm: Normal rate and regular rhythm.     Pulses: Normal pulses.     Heart sounds: Normal heart sounds. No murmur heard.    No friction rub. No gallop.  Pulmonary:     Effort: Pulmonary effort is normal.     Breath sounds: Normal breath sounds. No wheezing, rhonchi or rales.  Abdominal:     General: Abdomen is flat. Bowel sounds are normal. There is no distension.     Palpations: Abdomen is soft.      Tenderness: There is no abdominal tenderness.  Musculoskeletal:        General: No swelling. Normal range of motion.     Cervical back: Normal range of motion.     Right lower leg: No edema.     Left lower leg: No edema.  Lymphadenopathy:     Cervical: No cervical adenopathy.  Skin:    General: Skin is warm and dry.     Capillary Refill: Capillary refill takes less than 2 seconds.     Coloration: Skin is not jaundiced.  Neurological:     General: No focal deficit present.     Mental Status: She is alert and oriented to person, place, and time.  Psychiatric:        Mood and Affect: Mood normal.        Behavior: Behavior normal.   Last CBC Lab Results  Component Value Date   WBC 6.8 08/24/2022   HGB 14.9 08/24/2022   HCT 45.5 08/24/2022   MCV 93 08/24/2022   MCH 30.3 08/24/2022   RDW 12.5 08/24/2022   PLT 181 08/24/2022   Last metabolic panel Lab Results  Component Value Date   GLUCOSE 97 08/24/2022   NA 140 08/24/2022   K 4.7 08/24/2022   CL 103 08/24/2022   CO2 22 08/24/2022   BUN 9 08/24/2022   CREATININE 0.81 08/24/2022   EGFR 83 08/24/2022   CALCIUM 9.0 08/24/2022   PROT 6.4 08/24/2022   ALBUMIN 4.1 08/24/2022   LABGLOB 2.3 08/24/2022   AGRATIO 1.8 08/24/2022   BILITOT <0.2 08/24/2022   ALKPHOS 98 08/24/2022   AST 17 08/24/2022   ALT 17 08/24/2022   ANIONGAP 11 01/07/2019   Last lipids Lab Results  Component Value Date   CHOL 175 08/24/2022   HDL 52 08/24/2022   LDLCALC 83 08/24/2022   TRIG 244 (H) 08/24/2022   CHOLHDL 3.4 08/24/2022   Last hemoglobin A1c Lab Results  Component Value Date   HGBA1C 6.3 (H) 08/24/2022   Last thyroid  functions Lab Results  Component Value Date   TSH 2.050 08/24/2022   T4TOTAL 8.1 09/12/2015   Last vitamin D  Lab Results  Component Value Date   VD25OH 32.6 08/24/2022   The 10-year ASCVD risk score (Arnett DK, et al., 2019) is: 5.3%    Assessment & Plan:   Problem List Items Addressed This Visit        Nicotine abuse   Currently smokes 0.5 packs/day of cigarettes and has been smoking since age 69.  She is contemplating cessation but has not selected a quit date.  -Patient was congratulated on taking the initial steps towards  smoking cessation.  I recommended that she select a quit date as a next step.  We discussed treatment options to assist with smoking cessation.  She is interested in nicotine replacement therapy.  She will notify our office when she is ready to quit and appropriate NRT will be prescribed. -Indications for lung cancer screening were reviewed.  For now, she has declined a referral to the lung cancer screening program.      Fibromyalgia - Primary   Adequately controlled with Effexor, Lyrica, and Norco.  Followed by [facility] neurology Anell Monte)      Hypersomnia   Currently prescribed modafinil, which is managed by [facility] neurology.      Prediabetes   A1c 6.3 on labs from April 2024.  She has focused on lifestyle modifications aimed at weight loss.  Repeat labs prior to follow-up in 3 months.      Chronic low back pain   Chronic lumbar back pain.  Currently prescribed Norco for as needed pain relief.  She also takes meloxicam  and tizanidine regularly.  Recently underwent MRI of the lumbar spine.  Read is pending.  Followed by [facility].      Return in about 3 months (around 09/06/2023).   Manus FORBES Fireman, MD

## 2023-07-13 DIAGNOSIS — G4733 Obstructive sleep apnea (adult) (pediatric): Secondary | ICD-10-CM | POA: Diagnosis not present

## 2023-07-13 DIAGNOSIS — M545 Low back pain, unspecified: Secondary | ICD-10-CM | POA: Diagnosis not present

## 2023-07-13 DIAGNOSIS — M5432 Sciatica, left side: Secondary | ICD-10-CM | POA: Diagnosis not present

## 2023-07-13 DIAGNOSIS — M542 Cervicalgia: Secondary | ICD-10-CM | POA: Diagnosis not present

## 2023-07-13 DIAGNOSIS — Z79899 Other long term (current) drug therapy: Secondary | ICD-10-CM | POA: Diagnosis not present

## 2023-07-13 DIAGNOSIS — G4710 Hypersomnia, unspecified: Secondary | ICD-10-CM | POA: Diagnosis not present

## 2023-07-13 DIAGNOSIS — M5416 Radiculopathy, lumbar region: Secondary | ICD-10-CM | POA: Diagnosis not present

## 2023-08-24 DIAGNOSIS — Z6835 Body mass index (BMI) 35.0-35.9, adult: Secondary | ICD-10-CM | POA: Diagnosis not present

## 2023-08-24 DIAGNOSIS — M48062 Spinal stenosis, lumbar region with neurogenic claudication: Secondary | ICD-10-CM | POA: Diagnosis not present

## 2023-09-07 ENCOUNTER — Encounter: Payer: Self-pay | Admitting: Internal Medicine

## 2023-09-07 ENCOUNTER — Ambulatory Visit (INDEPENDENT_AMBULATORY_CARE_PROVIDER_SITE_OTHER): Payer: Medicare HMO | Admitting: Internal Medicine

## 2023-09-07 VITALS — BP 128/81 | HR 81 | Ht 64.0 in | Wt 207.2 lb

## 2023-09-07 DIAGNOSIS — Z5320 Procedure and treatment not carried out because of patient's decision for unspecified reasons: Secondary | ICD-10-CM

## 2023-09-07 DIAGNOSIS — E782 Mixed hyperlipidemia: Secondary | ICD-10-CM

## 2023-09-07 DIAGNOSIS — Z720 Tobacco use: Secondary | ICD-10-CM

## 2023-09-07 DIAGNOSIS — E559 Vitamin D deficiency, unspecified: Secondary | ICD-10-CM | POA: Diagnosis not present

## 2023-09-07 DIAGNOSIS — R7303 Prediabetes: Secondary | ICD-10-CM | POA: Diagnosis not present

## 2023-09-07 NOTE — Assessment & Plan Note (Signed)
 She reports that she quit smoking 1.5 months ago.  She previously smoked 0.5 packs/day of cigarettes and has been smoking since age 63.  She was congratulated on her success in quitting smoking and encouraged to maintain cessation.  We reviewed indication for lung cancer screening again today and she continues to decline screening.

## 2023-09-07 NOTE — Patient Instructions (Signed)
 It was a pleasure to see you today.  Thank you for giving us  the opportunity to be involved in your care.  Below is a brief recap of your visit and next steps.  We will plan to see you again in 6 months.  Summary No medication changes today Repeat labs ordered Follow up in 6 months  Congratulations on quitting smoking!

## 2023-09-07 NOTE — Progress Notes (Signed)
 Established Patient Office Visit  Subjective   Patient ID: Amanda Wilcox, female    DOB: 21-Jul-1960  Age: 63 y.o. MRN: [id number]  Chief Complaint  Patient presents with   Care Management    Three month follow up    [name] returns to care today for routine follow-up.  She was last evaluated by me on 1/14.  No medication changes were made at that time and 27-month follow-up was arranged.  In the interim, she has been seen by neurology for follow-up.  There have otherwise been no acute interval events.  Today she reports feeling well and has no acute concerns to discuss.  She states that she quit smoking 1.5 months ago after having pneumonia.  Past Medical History:  Diagnosis Date   Acute perforated appendicitis 01/04/2019   Anxiety    Trying to quit smoking - has cut back.   Arthralgia    Colon cancer screening 02/02/2019   Fatigue    Generalized abdominal pain 01/02/2019   Neuromuscular disorder (HCC)    fibromyalgia   Perforated appendicitis    Restless leg syndrome    Shortness of breath 01/02/2019   Sleep apnea    Past Surgical History:  Procedure Laterality Date   CARPAL TUNNEL RELEASE Right    COLONOSCOPY WITH PROPOFOL N/A 02/24/2019   Procedure: COLONOSCOPY WITH PROPOFOL;  Surgeon: Malissa Hippo, MD;  Location: [facility];  Service: Endoscopy;  Laterality: N/A;   LAPAROSCOPIC APPENDECTOMY Right 03/08/2019   Procedure: APPENDECTOMY LAPAROSCOPIC;  Surgeon: Lucretia Roers, MD;  Location: [facility];  Service: General;  Laterality: Right;   POLYPECTOMY  02/24/2019   Procedure: POLYPECTOMY;  Surgeon: Malissa Hippo, MD;  Location: [facility];  Service: Endoscopy;;  sigmoid and transverse   Social History   Tobacco Use   Smoking status: Every Day    Current packs/day: 0.50    Average packs/day: 0.5 packs/day for 40.0 years (20.0 ttl pk-yrs)    Types: Cigarettes   Smokeless tobacco: Never  Vaping Use   Vaping status: Never Used  Substance Use Topics    Alcohol use: No    Alcohol/week: 0.0 standard drinks of alcohol   Drug use: No   Family History  Problem Relation Age of Onset   Stroke Mother    Epilepsy Maternal Aunt    Multiple sclerosis Maternal Uncle    Heart disease Maternal Grandfather    No Known Allergies  Review of Systems  Constitutional:  Negative for chills and fever.  HENT:  Negative for sore throat.   Respiratory:  Negative for cough and shortness of breath.   Cardiovascular:  Negative for chest pain, palpitations and leg swelling.  Gastrointestinal:  Negative for abdominal pain, blood in stool, constipation, diarrhea, nausea and vomiting.  Genitourinary:  Negative for dysuria and hematuria.  Musculoskeletal:  Negative for myalgias.  Skin:  Negative for itching and rash.  Neurological:  Negative for dizziness and headaches.  Psychiatric/Behavioral:  Negative for depression and suicidal ideas.       Objective:     BP 128/81   Pulse 81   Ht 5\' 4"  (1.626 m)   Wt 207 lb 3.2 oz (94 kg)   SpO2 90%   BMI 35.57 kg/m  BP Readings from Last 3 Encounters:  09/07/23 128/81  06/08/23 110/72  08/24/22 139/75   Physical Exam Vitals reviewed.  Constitutional:      General: She is not in acute distress.    Appearance: Normal appearance. She  is obese. She is not toxic-appearing.  HENT:     Head: Normocephalic and atraumatic.     Right Ear: External ear normal.     Left Ear: External ear normal.     Nose: Nose normal. No congestion or rhinorrhea.     Mouth/Throat:     Mouth: Mucous membranes are moist.     Pharynx: Oropharynx is clear. No oropharyngeal exudate or posterior oropharyngeal erythema.  Eyes:     General: No scleral icterus.    Extraocular Movements: Extraocular movements intact.     Conjunctiva/sclera: Conjunctivae normal.     Pupils: Pupils are equal, round, and reactive to light.  Cardiovascular:     Rate and Rhythm: Normal rate and regular rhythm.     Pulses: Normal pulses.     Heart sounds:  Normal heart sounds. No murmur heard.    No friction rub. No gallop.  Pulmonary:     Effort: Pulmonary effort is normal.     Breath sounds: Normal breath sounds. No wheezing, rhonchi or rales.  Abdominal:     General: Abdomen is flat. Bowel sounds are normal. There is no distension.     Palpations: Abdomen is soft.     Tenderness: There is no abdominal tenderness.  Musculoskeletal:        General: No swelling. Normal range of motion.     Cervical back: Normal range of motion.     Right lower leg: No edema.     Left lower leg: No edema.  Lymphadenopathy:     Cervical: No cervical adenopathy.  Skin:    General: Skin is warm and dry.     Capillary Refill: Capillary refill takes less than 2 seconds.     Coloration: Skin is not jaundiced.  Neurological:     General: No focal deficit present.     Mental Status: She is alert and oriented to person, place, and time.  Psychiatric:        Mood and Affect: Mood normal.        Behavior: Behavior normal.   Last CBC Lab Results  Component Value Date   WBC 6.8 08/24/2022   HGB 14.9 08/24/2022   HCT 45.5 08/24/2022   MCV 93 08/24/2022   MCH 30.3 08/24/2022   RDW 12.5 08/24/2022   PLT 181 08/24/2022   Last metabolic panel Lab Results  Component Value Date   GLUCOSE 97 08/24/2022   NA 140 08/24/2022   K 4.7 08/24/2022   CL 103 08/24/2022   CO2 22 08/24/2022   BUN 9 08/24/2022   CREATININE 0.81 08/24/2022   EGFR 83 08/24/2022   CALCIUM 9.0 08/24/2022   PROT 6.4 08/24/2022   ALBUMIN 4.1 08/24/2022   LABGLOB 2.3 08/24/2022   AGRATIO 1.8 08/24/2022   BILITOT <0.2 08/24/2022   ALKPHOS 98 08/24/2022   AST 17 08/24/2022   ALT 17 08/24/2022   ANIONGAP 11 01/07/2019   Last lipids Lab Results  Component Value Date   CHOL 175 08/24/2022   HDL 52 08/24/2022   LDLCALC 83 08/24/2022   TRIG 244 (H) 08/24/2022   CHOLHDL 3.4 08/24/2022   Last hemoglobin A1c Lab Results  Component Value Date   HGBA1C 6.3 (H) 08/24/2022   Last  thyroid functions Lab Results  Component Value Date   TSH 2.050 08/24/2022   T4TOTAL 8.1 09/12/2015   Last vitamin D Lab Results  Component Value Date   VD25OH 32.6 08/24/2022   The 10-year ASCVD risk score (Arnett DK, et  al., 2019) is: 7%    Assessment & Plan:   Problem List Items Addressed This Visit       Nicotine abuse   She reports that she quit smoking 1.5 months ago.  She previously smoked 0.5 packs/day of cigarettes and has been smoking since age 66.  She was congratulated on her success in quitting smoking and encouraged to maintain cessation.  We reviewed indication for lung cancer screening again today and she continues to decline screening.      Hyperlipidemia   Noted on previous labs.  Regular panel ordered today.      Prediabetes - Primary   A1c 6.3 on labs from April 2024.  She has focused on lifestyle modifications and weight loss.  Repeat A1c ordered today.      Return in about 6 months (around 03/08/2024).   Tobi Fortes, MD

## 2023-09-07 NOTE — Assessment & Plan Note (Signed)
 Noted on previous labs.  Regular panel ordered today.

## 2023-09-07 NOTE — Assessment & Plan Note (Signed)
 A1c 6.3 on labs from April 2024.  She has focused on lifestyle modifications and weight loss.  Repeat A1c ordered today.

## 2023-09-08 ENCOUNTER — Encounter: Payer: Self-pay | Admitting: Internal Medicine

## 2023-09-08 LAB — CMP14+EGFR
ALT: 16 IU/L (ref 0–32)
AST: 17 IU/L (ref 0–40)
Albumin: 4.4 g/dL (ref 3.9–4.9)
Alkaline Phosphatase: 98 IU/L (ref 44–121)
BUN/Creatinine Ratio: 13 (ref 12–28)
BUN: 12 mg/dL (ref 8–27)
Bilirubin Total: <0.2 mg/dL (ref 0.0–1.2)
CO2: 25 mmol/L (ref 20–29)
Calcium: 9.4 mg/dL (ref 8.7–10.3)
Chloride: 99 mmol/L (ref 96–106)
Creatinine, Ser: 0.9 mg/dL (ref 0.57–1.00)
Globulin, Total: 2.7 g/dL (ref 1.5–4.5)
Glucose: 118 mg/dL — ABNORMAL HIGH (ref 70–99)
Potassium: 5 mmol/L (ref 3.5–5.2)
Sodium: 140 mmol/L (ref 134–144)
Total Protein: 7.1 g/dL (ref 6.0–8.5)
eGFR: 73 mL/min/{1.73_m2} (ref >59)

## 2023-09-08 LAB — CBC WITH DIFFERENTIAL/PLATELET
Basophils Absolute: 0 10*3/uL (ref 0.0–0.2)
Basos: 1 %
EOS (ABSOLUTE): 0.3 10*3/uL (ref 0.0–0.4)
Eos: 5 %
Hematocrit: 46 % (ref 34.0–46.6)
Hemoglobin: 15.1 g/dL (ref 11.1–15.9)
Immature Grans (Abs): 0 10*3/uL (ref 0.0–0.1)
Immature Granulocytes: 0 %
Lymphocytes Absolute: 1.5 10*3/uL (ref 0.7–3.1)
Lymphs: 23 %
MCH: 29.7 pg (ref 26.6–33.0)
MCHC: 32.8 g/dL (ref 31.5–35.7)
MCV: 90 fL (ref 79–97)
Monocytes Absolute: 0.4 10*3/uL (ref 0.1–0.9)
Monocytes: 6 %
Neutrophils Absolute: 4.2 10*3/uL (ref 1.4–7.0)
Neutrophils: 65 %
Platelets: 200 10*3/uL (ref 150–450)
RBC: 5.09 x10E6/uL (ref 3.77–5.28)
RDW: 11.9 % (ref 11.7–15.4)
WBC: 6.5 10*3/uL (ref 3.4–10.8)

## 2023-09-08 LAB — LIPID PANEL
Chol/HDL Ratio: 3 ratio (ref 0.0–4.4)
Cholesterol, Total: 196 mg/dL (ref 100–199)
HDL: 65 mg/dL (ref >39)
LDL Chol Calc (NIH): 100 mg/dL — ABNORMAL HIGH (ref 0–99)
Triglycerides: 183 mg/dL — ABNORMAL HIGH (ref 0–149)
VLDL Cholesterol Cal: 31 mg/dL (ref 5–40)

## 2023-09-08 LAB — HEMOGLOBIN A1C
Est. average glucose Bld gHb Est-mCnc: 134 mg/dL
Hgb A1c MFr Bld: 6.3 % — ABNORMAL HIGH (ref 4.8–5.6)

## 2023-09-08 LAB — TSH+FREE T4
Free T4: 0.94 ng/dL (ref 0.82–1.77)
TSH: 1.86 u[IU]/mL (ref 0.450–4.500)

## 2023-09-08 LAB — VITAMIN D 25 HYDROXY (VIT D DEFICIENCY, FRACTURES): Vit D, 25-Hydroxy: 35.5 ng/mL (ref 30.0–100.0)

## 2023-09-08 LAB — B12 AND FOLATE PANEL
Folate: 8.6 ng/mL (ref >3.0)
Vitamin B-12: 474 pg/mL (ref 232–1245)

## 2023-09-13 DIAGNOSIS — M48061 Spinal stenosis, lumbar region without neurogenic claudication: Secondary | ICD-10-CM | POA: Insufficient documentation

## 2023-09-14 DIAGNOSIS — M48062 Spinal stenosis, lumbar region with neurogenic claudication: Secondary | ICD-10-CM | POA: Diagnosis not present

## 2023-09-29 DIAGNOSIS — M48062 Spinal stenosis, lumbar region with neurogenic claudication: Secondary | ICD-10-CM | POA: Diagnosis not present

## 2023-10-12 DIAGNOSIS — M5432 Sciatica, left side: Secondary | ICD-10-CM | POA: Diagnosis not present

## 2023-10-12 DIAGNOSIS — M5416 Radiculopathy, lumbar region: Secondary | ICD-10-CM | POA: Diagnosis not present

## 2023-10-12 DIAGNOSIS — Z79899 Other long term (current) drug therapy: Secondary | ICD-10-CM | POA: Diagnosis not present

## 2023-10-12 DIAGNOSIS — M545 Low back pain, unspecified: Secondary | ICD-10-CM | POA: Diagnosis not present

## 2023-10-12 DIAGNOSIS — M542 Cervicalgia: Secondary | ICD-10-CM | POA: Diagnosis not present

## 2023-10-12 DIAGNOSIS — G4710 Hypersomnia, unspecified: Secondary | ICD-10-CM | POA: Diagnosis not present

## 2023-10-12 DIAGNOSIS — G4733 Obstructive sleep apnea (adult) (pediatric): Secondary | ICD-10-CM | POA: Diagnosis not present

## 2023-11-23 DIAGNOSIS — M48062 Spinal stenosis, lumbar region with neurogenic claudication: Secondary | ICD-10-CM | POA: Diagnosis not present

## 2023-11-23 DIAGNOSIS — Z6833 Body mass index (BMI) 33.0-33.9, adult: Secondary | ICD-10-CM | POA: Diagnosis not present

## 2024-01-11 DIAGNOSIS — M542 Cervicalgia: Secondary | ICD-10-CM | POA: Diagnosis not present

## 2024-01-11 DIAGNOSIS — M545 Low back pain, unspecified: Secondary | ICD-10-CM | POA: Diagnosis not present

## 2024-01-11 DIAGNOSIS — M5416 Radiculopathy, lumbar region: Secondary | ICD-10-CM | POA: Diagnosis not present

## 2024-01-11 DIAGNOSIS — G4733 Obstructive sleep apnea (adult) (pediatric): Secondary | ICD-10-CM | POA: Diagnosis not present

## 2024-01-11 DIAGNOSIS — Z79899 Other long term (current) drug therapy: Secondary | ICD-10-CM | POA: Diagnosis not present

## 2024-01-11 DIAGNOSIS — M5432 Sciatica, left side: Secondary | ICD-10-CM | POA: Diagnosis not present

## 2024-01-11 DIAGNOSIS — G4710 Hypersomnia, unspecified: Secondary | ICD-10-CM | POA: Diagnosis not present

## 2024-01-19 DIAGNOSIS — Z0100 Encounter for examination of eyes and vision without abnormal findings: Secondary | ICD-10-CM | POA: Diagnosis not present

## 2024-01-19 DIAGNOSIS — H5203 Hypermetropia, bilateral: Secondary | ICD-10-CM | POA: Diagnosis not present

## 2024-02-03 ENCOUNTER — Encounter (INDEPENDENT_AMBULATORY_CARE_PROVIDER_SITE_OTHER): Payer: Self-pay | Admitting: *Deleted

## 2024-03-08 ENCOUNTER — Encounter (INDEPENDENT_AMBULATORY_CARE_PROVIDER_SITE_OTHER): Payer: Self-pay | Admitting: Gastroenterology

## 2024-03-08 ENCOUNTER — Ambulatory Visit

## 2024-03-08 VITALS — BP 128/74 | HR 88 | Ht 61.0 in | Wt 197.1 lb

## 2024-03-08 DIAGNOSIS — E782 Mixed hyperlipidemia: Secondary | ICD-10-CM | POA: Diagnosis not present

## 2024-03-08 DIAGNOSIS — J410 Simple chronic bronchitis: Secondary | ICD-10-CM | POA: Insufficient documentation

## 2024-03-08 DIAGNOSIS — E559 Vitamin D deficiency, unspecified: Secondary | ICD-10-CM | POA: Diagnosis not present

## 2024-03-08 DIAGNOSIS — R7303 Prediabetes: Secondary | ICD-10-CM | POA: Diagnosis not present

## 2024-03-08 NOTE — Progress Notes (Signed)
 Established Patient Office Visit  Subjective   Patient ID: Amanda Wilcox, female    DOB: 07-09-1960  Age: 63 y.o. MRN: [id number]  Chief Complaint  Patient presents with   Medical Management of Chronic Issues    Pt here for a follow up    HPI Discussed the use of AI scribe software for clinical note transcription with the patient, who gave verbal consent to proceed.  History of Present Illness   Amanda Wilcox is a 63 year old female who presents for a six-month follow-up regarding skin lesions on her legs.  Cutaneous lesions of the lower extremities - Persistent skin lesions on the legs attributed to scratching due to nervousness - Lesions begin as small bumps and enlarge with scratching, resulting in larger lesions - Cryotherapy previously performed by dermatologist, perceived as too aggressive and resulted in deeper lesions - Cancer ruled out by prior testing - Continued development of new bumps that are scratched and become larger lesions - No significant pruritus associated with the lesions - Habitual tendency to scratch or pick at scabs since childhood  Nodular lesions of the elbow - Presence of nodules on the elbow - History of fibromyalgia and arthritis  Medication effects - Hydrocodone  use, which can cause itching - No significant pruritus associated with current lesions      Patient Active Problem List   Diagnosis Date Noted   Simple chronic bronchitis (HCC) 03/08/2024   Spinal stenosis of lumbar region 09/13/2023   Chronic low back pain 06/08/2023   Hypersomnia 08/24/2022   Prediabetes 08/24/2022   Encounter for general adult medical examination with abnormal findings 08/21/2021   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   Chronic constipation 06/03/2021   Pancreatic cyst 06/03/2021   Hyperlipidemia 08/17/2019   Vitamin D  deficiency 08/17/2019   Nicotine abuse 01/02/2019   Fibromyalgia 01/02/2019   Morbid obesity (HCC) 01/02/2019      ROS    Objective:      BP 128/74   Pulse 88   Ht 5' 1 (1.549 m)   Wt 197 lb 1.3 oz (89.4 kg)   SpO2 91%   BMI 37.24 kg/m  BP Readings from Last 3 Encounters:  03/08/24 128/74  09/07/23 128/81  06/08/23 110/72   Wt Readings from Last 3 Encounters:  03/08/24 197 lb 1.3 oz (89.4 kg)  09/07/23 207 lb 3.2 oz (94 kg)  06/08/23 207 lb 3.2 oz (94 kg)      Physical Exam Vitals and nursing note reviewed.  Constitutional:      Appearance: Normal appearance.  HENT:     Head: Normocephalic.  Eyes:     Extraocular Movements: Extraocular movements intact.     Pupils: Pupils are equal, round, and reactive to light.  Cardiovascular:     Rate and Rhythm: Normal rate and regular rhythm.  Pulmonary:     Effort: Pulmonary effort is normal.     Breath sounds: Normal breath sounds.  Musculoskeletal:     Cervical back: Normal range of motion and neck supple.  Neurological:     Mental Status: She is alert and oriented to person, place, and time.  Psychiatric:        Mood and Affect: Mood normal.        Thought Content: Thought content normal.      No results found for any visits on 03/08/24.    The 10-year ASCVD risk score (Arnett DK, et al., 2019) is: 3.6%    Assessment & Plan:   Problem List Items  Addressed This Visit       Other   Hyperlipidemia   Noted on previous labs.  Regular panel ordered today.      Relevant Orders   CMP14+EGFR   Lipid Profile   TSH + free T4   Vitamin D  deficiency   History of vitamin D  deficiency.  Will be getting vitamin D  level drawn and treat accordingly      Relevant Orders   Vitamin D  (25 hydroxy)   Prediabetes - Primary   A1C was 6.3 in April 2025.  She has focused on lifestyle modifications and weight loss.  Repeat A1c ordered today.      Relevant Orders   CMP14+EGFR   HgB A1c       No follow-ups on file.    Leita Longs, FNP

## 2024-03-12 NOTE — Assessment & Plan Note (Signed)
 Noted on previous labs.  Regular panel ordered today.

## 2024-03-12 NOTE — Assessment & Plan Note (Signed)
 A1C was 6.3 in April 2025.  She has focused on lifestyle modifications and weight loss.  Repeat A1c ordered today.

## 2024-03-12 NOTE — Assessment & Plan Note (Signed)
History of vitamin D deficiency.  Will be getting vitamin D level drawn and treat accordingly

## 2024-04-11 DIAGNOSIS — M542 Cervicalgia: Secondary | ICD-10-CM | POA: Diagnosis not present

## 2024-04-11 DIAGNOSIS — M5432 Sciatica, left side: Secondary | ICD-10-CM | POA: Diagnosis not present

## 2024-04-11 DIAGNOSIS — M5416 Radiculopathy, lumbar region: Secondary | ICD-10-CM | POA: Diagnosis not present

## 2024-04-11 DIAGNOSIS — G4710 Hypersomnia, unspecified: Secondary | ICD-10-CM | POA: Diagnosis not present

## 2024-04-11 DIAGNOSIS — Z79899 Other long term (current) drug therapy: Secondary | ICD-10-CM | POA: Diagnosis not present

## 2024-04-11 DIAGNOSIS — G4733 Obstructive sleep apnea (adult) (pediatric): Secondary | ICD-10-CM | POA: Diagnosis not present

## 2024-04-11 DIAGNOSIS — M545 Low back pain, unspecified: Secondary | ICD-10-CM | POA: Diagnosis not present

## 2024-06-14 NOTE — Progress Notes (Signed)
 Amanda Wilcox                                          MRN: [id number]   06/14/2024   The [facility] Quality Team Specialist reviewed this patient medical record for the purposes of chart review for care gap closure. The following were reviewed: chart review for care gap closure-breast cancer screening.    [facility] Quality Team

## 2024-09-06 ENCOUNTER — Ambulatory Visit
